# Patient Record
Sex: Female | Born: 1990 | Race: White | Hispanic: No | Marital: Married | Smoking: Never smoker
Health system: Southern US, Community
[De-identification: ages and names within clinical notes are randomized; demographics above are authoritative.]

## PROBLEM LIST (undated history)

## (undated) DIAGNOSIS — C8190 Hodgkin lymphoma, unspecified, unspecified site: Secondary | ICD-10-CM

## (undated) DIAGNOSIS — Z808 Family history of malignant neoplasm of other organs or systems: Secondary | ICD-10-CM

## (undated) DIAGNOSIS — Z923 Personal history of irradiation: Secondary | ICD-10-CM

## (undated) DIAGNOSIS — Z8042 Family history of malignant neoplasm of prostate: Secondary | ICD-10-CM

## (undated) DIAGNOSIS — I341 Nonrheumatic mitral (valve) prolapse: Secondary | ICD-10-CM

## (undated) DIAGNOSIS — Z800 Family history of malignant neoplasm of digestive organs: Secondary | ICD-10-CM

## (undated) DIAGNOSIS — J45909 Unspecified asthma, uncomplicated: Secondary | ICD-10-CM

## (undated) HISTORY — DX: Family history of malignant neoplasm of other organs or systems: Z80.8

## (undated) HISTORY — DX: Family history of malignant neoplasm of digestive organs: Z80.0

## (undated) HISTORY — DX: Family history of malignant neoplasm of prostate: Z80.42

## (undated) HISTORY — DX: Unspecified asthma, uncomplicated: J45.909

## (undated) HISTORY — DX: Nonrheumatic mitral (valve) prolapse: I34.1

## (undated) HISTORY — PX: KNEE SURGERY: SHX244

## (undated) HISTORY — DX: Hodgkin lymphoma, unspecified, unspecified site: C81.90

## (undated) HISTORY — PX: DEEP NECK LYMPH NODE BIOPSY / EXCISION: SUR126

---
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
## 2011-01-01 ENCOUNTER — Ambulatory Visit: Payer: Self-pay

## 2011-01-01 ENCOUNTER — Other Ambulatory Visit: Payer: Self-pay | Admitting: Orthopedic Surgery

## 2011-01-01 DIAGNOSIS — M545 Low back pain, unspecified: Secondary | ICD-10-CM

## 2012-07-03 DIAGNOSIS — J4520 Mild intermittent asthma, uncomplicated: Secondary | ICD-10-CM | POA: Insufficient documentation

## 2013-02-23 ENCOUNTER — Encounter: Payer: Self-pay | Admitting: Certified Nurse Midwife

## 2013-02-24 ENCOUNTER — Encounter: Payer: Self-pay | Admitting: Certified Nurse Midwife

## 2013-02-24 ENCOUNTER — Ambulatory Visit (INDEPENDENT_AMBULATORY_CARE_PROVIDER_SITE_OTHER): Payer: BC Managed Care – PPO | Admitting: Certified Nurse Midwife

## 2013-02-24 VITALS — BP 90/62 | HR 64 | Resp 16

## 2013-02-24 DIAGNOSIS — Z3009 Encounter for other general counseling and advice on contraception: Secondary | ICD-10-CM

## 2013-02-24 MED ORDER — ETONOGESTREL-ETHINYL ESTRADIOL 0.12-0.015 MG/24HR VA RING: 1.0000 | VAGINAL_RING | VAGINAL | Status: DC

## 2013-02-24 NOTE — Progress Notes (Signed)
22 yo swf gopo here for follow-up of Trisprintec use for BTB initiated 10-04-12.  Taking medication as prescribed.  Denies missed pills or late pills.  Using condoms as back up protection. Continues with BTB in second and third week of pills. No cramping. Had previously been on Junel 1.5/30 Fe with continous bleeding issues.  Profile slightly better.  History of nodular Hodgkins Lymphoma in remission, still under follow up. Getting married in July!!  Interested in Nuvaring trial as discussed at last visit .  Considered  Mirena IUD as discussed last visit, but not comfortable with this as contraception.  Questions addressed at length regarding Nuvaring and Mirena IUD. No STD concerns or testing desired.  No health issues today  O:Healthy female, WD WN  Affect: normal orientataion x3 smiling  Exam: Thyroid not enlarged, (TSH) checked last exam normal) Pelvic exam: External Genital area: normal appearance no lesions BUS: negative Urethra/Urethral meatus/bladder: normal Vagina: normal, no blood present  Patient inserted Nuvaring with Provider check without problems Cervix: not tender, normal appearance Uterus: normal, non tender, anteflexed Adnexa: non tender, no masses palpated Perineal area: no lesions  A: Normal pelvic exam Break through bleeding on Trisprintec desires contraception change Nodular Hodgkins Lymphoma History in remission under follow up  P:Reviewed findings Reviewed Nuvaring instructions at length with booklet and when to start after stopping OCP Rx Nuvaring see order  Keep menses calendar Wished well with marriage RV aex 8-14 or prn Questions addressed at length  20 minutes spent with patient with >50% of time spent in face to face counseling. Reviewed, TL

## 2013-03-17 ENCOUNTER — Other Ambulatory Visit: Payer: Self-pay | Admitting: Certified Nurse Midwife

## 2013-03-17 DIAGNOSIS — I341 Nonrheumatic mitral (valve) prolapse: Secondary | ICD-10-CM | POA: Insufficient documentation

## 2013-03-17 NOTE — Telephone Encounter (Signed)
eScribe request for refill on Tri-Sprintec Pt had appt on 02/24/13 and was switched to Nuvaring.  RX denied.

## 2013-04-11 ENCOUNTER — Encounter: Payer: Self-pay | Admitting: Certified Nurse Midwife

## 2013-04-11 ENCOUNTER — Ambulatory Visit (INDEPENDENT_AMBULATORY_CARE_PROVIDER_SITE_OTHER): Payer: BC Managed Care – PPO | Admitting: Certified Nurse Midwife

## 2013-04-11 VITALS — BP 110/60 | HR 64 | Resp 16

## 2013-04-11 DIAGNOSIS — IMO0001 Reserved for inherently not codable concepts without codable children: Secondary | ICD-10-CM

## 2013-04-11 DIAGNOSIS — Z309 Encounter for contraceptive management, unspecified: Secondary | ICD-10-CM

## 2013-04-11 DIAGNOSIS — N949 Unspecified condition associated with female genital organs and menstrual cycle: Secondary | ICD-10-CM

## 2013-04-11 DIAGNOSIS — N925 Other specified irregular menstruation: Secondary | ICD-10-CM

## 2013-04-11 MED ORDER — NORGESTIM-ETH ESTRAD TRIPHASIC 0.18/0.215/0.25 MG-35 MCG PO TABS: 1.0000 | ORAL_TABLET | Freq: Every day | ORAL | Status: DC

## 2013-04-11 NOTE — Progress Notes (Signed)
22 y.o. Single Caucasian female G0P0000 with LMP May 25,2014, here complaing of  break through bleeding with contraceptive use of Nuvaring. Using contraception method as prescribed.  Denies inconsistent use.  No new medications. Describes light bleeding or spotting continuation after insertion for the first time. Nuvaring out now at usual period time with 3 days duration of period so far.  Flow is moderate with some cramping. Relieved with OTC medication or patient comfort measures. Patient expresses she has worried the whole month that the Nuvaring would fall out, too stressful.  Prefers to return to [facility] [place] use again. Denies fatigue or signs of anemia.  Declines Hgb today.  Vital Signs:  BP 110/60          Pulse 64   O: Healthy WN WD female   Alert oriented x3 Skin: warm and dry Thyroid:normal to inspection and palpation Pelvic exam: External genital area: normal, no lesions BUS: negative Vagina: small amount blood present in vagina Cervix: non tender, normal appearance, scant blood noted from cervix Uterus: normal, non tender Adnexa: no masses, non tender Perineal area: no lesions  A:Normal Pelvic exam with DUB on Nuvaring Contraception change desired OCP  P:Reviewed findings of normal pelvic exam Discussed that the transition period with Nuvaring should be resolving, but if she is not comfortable with use can go back on Trisprintec.  Reminded that note in chart relates to some spotting with the use of it also.  Patient aware, but feels better choice and it was only occasional spotting. Continue keeping menses calendar, advise if problems Rx Trisprintec see order      Reviewed, TL

## 2013-05-09 ENCOUNTER — Other Ambulatory Visit: Payer: Self-pay | Admitting: Certified Nurse Midwife

## 2013-05-10 NOTE — Telephone Encounter (Signed)
eScribe request for refill on Tri-Previfem Last filled - 04/11/13 x 1 year Last OV - 04/11/13 Next AEX - 06/19/13 RX denied.  RX has already been sent to pharmacy.

## 2013-06-19 ENCOUNTER — Encounter: Payer: Self-pay | Admitting: Certified Nurse Midwife

## 2013-06-19 ENCOUNTER — Ambulatory Visit (INDEPENDENT_AMBULATORY_CARE_PROVIDER_SITE_OTHER): Payer: BC Managed Care – PPO | Admitting: Certified Nurse Midwife

## 2013-06-19 VITALS — BP 104/64 | HR 68 | Resp 16 | Ht 66.75 in | Wt 137.0 lb

## 2013-06-19 DIAGNOSIS — Z01419 Encounter for gynecological examination (general) (routine) without abnormal findings: Secondary | ICD-10-CM

## 2013-06-19 MED ORDER — NORGESTIM-ETH ESTRAD TRIPHASIC 0.18/0.215/0.25 MG-35 MCG PO TABS: 1.0000 | ORAL_TABLET | Freq: Every day | ORAL | Status: DC

## 2013-06-19 NOTE — Patient Instructions (Signed)
General topics  Next pap or exam is  due in 1 year Take a Women's multivitamin Take 1200 mg. of calcium daily - prefer dietary If any concerns in interim to call back  Breast Self-Awareness Practicing breast self-awareness may pick up problems early, prevent significant medical complications, and possibly save your life. By practicing breast self-awareness, you can become familiar with how your breasts look and feel and if your breasts are changing. This allows you to notice changes early. It can also offer you some reassurance that your breast health is good. One way to learn what is normal for your breasts and whether your breasts are changing is to do a breast self-exam. If you find a lump or something that was not present in the past, it is best to contact your caregiver right away. Other findings that should be evaluated by your caregiver include nipple discharge, especially if it is bloody; skin changes or reddening; areas where the skin seems to be pulled in (retracted); or new lumps and bumps. Breast pain is seldom associated with cancer (malignancy), but should also be evaluated by a caregiver. BREAST SELF-EXAM The best time to examine your breasts is 5 7 days after your menstrual period is over.  ExitCare Patient Information 2013 [employer].   Exercise to Stay Healthy Exercise helps you become and stay healthy. EXERCISE IDEAS AND TIPS Choose exercises that:  You enjoy.  Fit into your day. You do not need to exercise really hard to be healthy. You can do exercises at a slow or medium level and stay healthy. You can:  Stretch before and after working out.  Try yoga, Pilates, or tai chi.  Lift weights.  Walk fast, swim, jog, run, climb stairs, bicycle, dance, or rollerskate.  Take aerobic classes. Exercises that burn about 150 calories:  Running 1  miles in 15 minutes.  Playing volleyball for 45 to 60 minutes.  Washing and waxing a car for 45 to 60  minutes.  Playing touch football for 45 minutes.  Walking 1  miles in 35 minutes.  Pushing a stroller 1  miles in 30 minutes.  Playing basketball for 30 minutes.  Raking leaves for 30 minutes.  Bicycling 5 miles in 30 minutes.  Walking 2 miles in 30 minutes.  Dancing for 30 minutes.  Shoveling snow for 15 minutes.  Swimming laps for 20 minutes.  Walking up stairs for 15 minutes.  Bicycling 4 miles in 15 minutes.  Gardening for 30 to 45 minutes.  Jumping rope for 15 minutes.  Washing windows or floors for 45 to 60 minutes. Document Released: 12/05/2010 Document Revised: 01/25/2012 Document Reviewed: 12/05/2010 ExitCare Patient Information 2013 [employer].   Other topics ( that may be useful information):    Sexually Transmitted Disease Sexually transmitted disease (STD) refers to any infection that is passed from person to person during sexual activity. This may happen by way of saliva, semen, blood, vaginal mucus, or urine. Common STDs include:  Gonorrhea.  Chlamydia.  Syphilis.  HIV/AIDS.  Genital herpes.  Hepatitis B and C.  Trichomonas.  Human papillomavirus (HPV).  Pubic lice. CAUSES  An STD may be spread by bacteria, virus, or parasite. A person can get an STD by:  Sexual intercourse with an infected person.  Sharing sex toys with an infected person.  Sharing needles with an infected person.  Having intimate contact with the genitals, mouth, or rectal areas of an infected person. SYMPTOMS  Some people may not have any symptoms, but   they can still pass the infection to others. Different STDs have different symptoms. Symptoms include:  Painful or bloody urination.  Pain in the pelvis, abdomen, vagina, anus, throat, or eyes.  Skin rash, itching, irritation, growths, or sores (lesions). These usually occur in the genital or anal area.  Abnormal vaginal discharge.  Penile discharge in men.  Soft, flesh-colored skin growths in the  genital or anal area.  Fever.  Pain or bleeding during sexual intercourse.  Swollen glands in the groin area.  Yellow skin and eyes (jaundice). This is seen with hepatitis. DIAGNOSIS  To make a diagnosis, your caregiver may:  Take a medical history.  Perform a physical exam.  Take a specimen (culture) to be examined.  Examine a sample of discharge under a microscope.  Perform blood test TREATMENT   Chlamydia, gonorrhea, trichomonas, and syphilis can be cured with antibiotic medicine.  Genital herpes, hepatitis, and HIV can be treated, but not cured, with prescribed medicines. The medicines will lessen the symptoms.  Genital warts from HPV can be treated with medicine or by freezing, burning (electrocautery), or surgery. Warts may come back.  HPV is a virus and cannot be cured with medicine or surgery.However, abnormal areas may be followed very closely by your caregiver and may be removed from the cervix, vagina, or vulva through office procedures or surgery. If your diagnosis is confirmed, your recent sexual partners need treatment. This is true even if they are symptom-free or have a negative culture or evaluation. They should not have sex until their caregiver says it is okay. HOME CARE INSTRUCTIONS  All sexual partners should be informed, tested, and treated for all STDs.  Take your antibiotics as directed. Finish them even if you start to feel better.  Only take over-the-counter or prescription medicines for pain, discomfort, or fever as directed by your caregiver.  Rest.  Eat a balanced diet and drink enough fluids to keep your urine clear or pale yellow.  Do not have sex until treatment is completed and you have followed up with your caregiver. STDs should be checked after treatment.  Keep all follow-up appointments, Pap tests, and blood tests as directed by your caregiver.  Only use latex condoms and water-soluble lubricants during sexual activity. Do not use  petroleum jelly or oils.  Avoid alcohol and illegal drugs.  Get vaccinated for HPV and hepatitis. If you have not received these vaccines in the past, talk to your caregiver about whether one or both might be right for you.  Avoid risky sex practices that can break the skin. The only way to avoid getting an STD is to avoid all sexual activity.Latex condoms and dental dams (for oral sex) will help lessen the risk of getting an STD, but will not completely eliminate the risk. SEEK MEDICAL CARE IF:   You have a fever.  You have any new or worsening symptoms. Document Released: 01/23/2003 Document Revised: 01/25/2012 Document Reviewed: 01/30/2011 ExitCare Patient Information 2013 [employer].    Domestic Abuse You are being battered or abused if someone close to you hits, pushes, or physically hurts you in any way. You also are being abused if you are forced into activities. You are being sexually abused if you are forced to have sexual contact of any kind. You are being emotionally abused if you are made to feel worthless or if you are constantly threatened. It is important to remember that help is available. No one has the right to abuse you. PREVENTION OF FURTHER   ABUSE  Learn the warning signs of danger. This varies with situations but may include: the use of alcohol, threats, isolation from friends and family, or forced sexual contact. Leave if you feel that violence is going to occur.  If you are attacked or beaten, report it to the police so the abuse is documented. You do not have to press charges. The police can protect you while you or the attackers are leaving. Get the officer's name and badge number and a copy of the report.  Find someone you can trust and tell them what is happening to you: your caregiver, a nurse, clergy member, close friend or family member. Feeling ashamed is natural, but remember that you have done nothing wrong. No one deserves abuse. Document Released:  10/30/2000 Document Revised: 01/25/2012 Document Reviewed: 01/08/2011 ExitCare Patient Information 2013 [employer].    How Much is Too Much Alcohol? Drinking too much alcohol can cause injury, accidents, and health problems. These types of problems can include:   Car crashes.  Falls.  Family fighting (domestic violence).  Drowning.  Fights.  Injuries.  Burns.  Damage to certain organs.  Having a baby with birth defects. ONE DRINK CAN BE TOO MUCH WHEN YOU ARE:  Working.  Pregnant or breastfeeding.  Taking medicines. Ask your doctor.  Driving or planning to drive. If you or someone you know has a drinking problem, get help from a doctor.  Document Released: 08/29/2009 Document Revised: 01/25/2012 Document Reviewed: 08/29/2009 ExitCare Patient Information 2013 [employer].   Smoking Hazards Smoking cigarettes is extremely bad for your health. Tobacco smoke has over 200 known poisons in it. There are over 60 chemicals in tobacco smoke that cause cancer. Some of the chemicals found in cigarette smoke include:   Cyanide.  Benzene.  Formaldehyde.  Methanol (wood alcohol).  Acetylene (fuel used in welding torches).  Ammonia. Cigarette smoke also contains the poisonous gases nitrogen oxide and carbon monoxide.  Cigarette smokers have an increased risk of many serious medical problems and Smoking causes approximately:  90% of all lung cancer deaths in men.  80% of all lung cancer deaths in women.  90% of deaths from chronic obstructive lung disease. Compared with nonsmokers, smoking increases the risk of:  Coronary heart disease by 2 to 4 times.  Stroke by 2 to 4 times.  Men developing lung cancer by 23 times.  Women developing lung cancer by 13 times.  Dying from chronic obstructive lung diseases by 12 times.  . Smoking is the most preventable cause of death and disease in our society.  WHY IS SMOKING ADDICTIVE?  Nicotine is the chemical  agent in tobacco that is capable of causing addiction or dependence.  When you smoke and inhale, nicotine is absorbed rapidly into the bloodstream through your lungs. Nicotine absorbed through the lungs is capable of creating a powerful addiction. Both inhaled and non-inhaled nicotine may be addictive.  Addiction studies of cigarettes and spit tobacco show that addiction to nicotine occurs mainly during the teen years, when young people begin using tobacco products. WHAT ARE THE BENEFITS OF QUITTING?  There are many health benefits to quitting smoking.   Likelihood of developing cancer and heart disease decreases. Health improvements are seen almost immediately.  Blood pressure, pulse rate, and breathing patterns start returning to normal soon after quitting. QUITTING SMOKING   American Lung Association - 1-800-LUNGUSA  American Cancer Society - 1-800-ACS-2345 Document Released: 12/10/2004 Document Revised: 01/25/2012 Document Reviewed: 08/14/2009 ExitCare Patient Information 2013 ExitCare[employer]   LL[employer].   Stress Management Stress is a state of physical or mental tension that often results from changes in your life or normal routine. Some common causes of stress are:  Death of a loved one.  Injuries or severe illnesses.  Getting fired or changing jobs.  Moving into a new home. Other causes may be:  Sexual problems.  Business or financial losses.  Taking on a large debt.  Regular conflict with someone at home or at work.  Constant tiredness from lack of sleep. It is not just bad things that are stressful. It may be stressful to:  Win the lottery.  Get married.  Buy a new car. The amount of stress that can be easily tolerated varies from person to person. Changes generally cause stress, regardless of the types of change. Too much stress can affect your health. It may lead to physical or emotional problems. Too little stress (boredom) may also become stressful. SUGGESTIONS TO  REDUCE STRESS:  Talk things over with your family and friends. It often is helpful to share your concerns and worries. If you feel your problem is serious, you may want to get help from a professional counselor.  Consider your problems one at a time instead of lumping them all together. Trying to take care of everything at once may seem impossible. List all the things you need to do and then start with the most important one. Set a goal to accomplish 2 or 3 things each day. If you expect to do too many in a single day you will naturally fail, causing you to feel even more stressed.  Do not use alcohol or drugs to relieve stress. Although you may feel better for a short time, they do not remove the problems that caused the stress. They can also be habit forming.  Exercise regularly - at least 3 times per week. Physical exercise can help to relieve that "uptight" feeling and will relax you.  The shortest distance between despair and hope is often a good night's sleep.  Go to bed and get up on time allowing yourself time for appointments without being rushed.  Take a short "time-out" period from any stressful situation that occurs during the day. Close your eyes and take some deep breaths. Starting with the muscles in your face, tense them, hold it for a few seconds, then relax. Repeat this with the muscles in your neck, shoulders, hand, stomach, back and legs.  Take good care of yourself. Eat a balanced diet and get plenty of rest.  Schedule time for having fun. Take a break from your daily routine to relax. HOME CARE INSTRUCTIONS   Call if you feel overwhelmed by your problems and feel you can no longer manage them on your own.  Return immediately if you feel like hurting yourself or someone else. Document Released: 04/28/2001 Document Revised: 01/25/2012 Document Reviewed: 12/19/2007 ExitCare Patient Information 2013 [employer].   

## 2013-06-19 NOTE — Progress Notes (Signed)
22 y.o. G0P0000 married Caucasian Fe here for annual exam. Periods normal. Contraception working well. Recently married and back from honeymoon ! Getting ready to start grad school. No health issues today.  Patient's last menstrual period was 06/09/2013.          Sexually active: yes  The current method of family planning is OCP (estrogen/progesterone).    Exercising: yes  weight lifting & cardio Smoker:  no  Health Maintenance: Pap:  06-17-12 neg MMG:  none Colonoscopy:  none BMD:   none TDaP:2010 Labs: none Self breast exam: done occ   reports that she has never smoked. She does not have any smokeless tobacco history on file. She reports that she drinks about 1.0 ounces of alcohol per week. She reports that she does not use illicit drugs.  Past Medical History  Diagnosis Date  . Hodgkin lymphoma     chemo, radiation. completed 8/09. treated @ [facility] stage II  . Asthma     Past Surgical History  Procedure Laterality Date  . Deep neck lymph node biopsy / excision    . Knee surgery      acl repair X 2    Current Outpatient Prescriptions  Medication Sig Dispense Refill  . Multiple Vitamins-Minerals (MULTIVITAMIN PO) Take by mouth daily.      . Norgestimate-Ethinyl Estradiol Triphasic (TRI-SPRINTEC) 0.18/0.215/0.25 MG-35 MCG tablet Take 1 tablet by mouth daily.       No current facility-administered medications for this visit.    Family History  Problem Relation Age of Onset  . Thyroid disease Mother 55    thyroidectomy  . Diabetes Father   . Diabetes Paternal Aunt   . Diabetes Paternal Grandmother     ROS:  Pertinent items are noted in HPI.  Otherwise, a comprehensive ROS was negative.  Exam:   BP 104/64  Pulse 68  Resp 16  Ht 5' 6.75" (1.695 m)  Wt 137 lb (62.143 kg)  BMI 21.63 kg/m2  LMP 06/09/2013 Height: 5' 6.75" (169.5 cm)  Ht Readings from Last 3 Encounters:  06/19/13 5' 6.75" (1.695 m)    General appearance: alert, cooperative and appears stated  age Head: Normocephalic, without obvious abnormality, atraumatic Neck: no adenopathy, supple, symmetrical, trachea midline and thyroid normal to inspection and palpation Lungs: clear to auscultation bilaterally Breasts: normal appearance, no masses or tenderness, No nipple retraction or dimpling, No nipple discharge or bleeding, No axillary or supraclavicular adenopathy Heart: regular rate and rhythm Abdomen: soft, non-tender; no masses,  no organomegaly Extremities: extremities normal, atraumatic, no cyanosis or edema Skin: Skin color, texture, turgor normal. No rashes or lesions Lymph nodes: Cervical, supraclavicular, and axillary nodes normal. No abnormal inguinal nodes palpated Neurologic: Grossly normal   Pelvic: External genitalia:  no lesions              Urethra:  normal appearing urethra with no masses, tenderness or lesions              Bartholin's and Skene's: normal                 Vagina: normal appearing vagina with normal color and discharge, no lesions              Cervix: normal, non tender              Pap taken: no Bimanual Exam:  Uterus:  normal size, contour, position, consistency, mobility, non-tender and anteflexed  Adnexa: normal adnexa and no mass, fullness, tenderness               Rectovaginal: Confirms               Anus:  normal sphincter tone, no lesions  A:  Well Woman with normal exam  Contraception OCP desires continuance  P:   Reviewed health and wellness pertinent to exam  Rx Trisprintec see order  Pap smear as per guidelines   pap smear not taken today  counseled on breast self exam, use and side effects of OCP's, adequate intake of calcium and vitamin D, diet and exercise  return annually or prn  An After Visit Summary was printed and given to the patient.

## 2013-06-19 NOTE — Progress Notes (Signed)
Note reviewed, agree with plan.  Stephane Niemann, MD  

## 2013-09-21 ENCOUNTER — Other Ambulatory Visit: Payer: Self-pay

## 2014-01-04 ENCOUNTER — Telehealth: Payer: Self-pay | Admitting: Certified Nurse Midwife

## 2014-01-04 NOTE — Telephone Encounter (Signed)
See previus message, accidentally closed encounter.

## 2014-01-04 NOTE — Telephone Encounter (Signed)
Agree with recommendation

## 2014-01-04 NOTE — Telephone Encounter (Signed)
Patient is having some vision problems. Patient says she "cannot see out of her right eye."

## 2014-01-04 NOTE — Telephone Encounter (Addendum)
Spoke with patient. She is tearful and at work. States that she woke up this morning and having trouble with R eye. States that "in my peripheral vision, things are moving, there are shapes, and I cannot focus." No issue with other eye. States "this problem always happens in the right eye." She states that these same symptoms have occurred twice prior, once "about a month ago and once about a year ago." Did not seek medical attention at that time.  Patient denies any neurological deficits, no weakness of extremities or facial weakness or numbness. Denies headaches. Denies recent head trauma or eye injury. Patient does not wear contacts or glasses. Patient does not have a pcp or eye doctor that she sees. States her eye is red but "I have been crying." States pupil is normal, no eye discharge.  Advised patient to hold on Taking her ocp Tri Sprintec until she has been seen by a provider.  Spoke with Dr. Quincy Simmonds and per Dr. Quincy Simmonds, patient should be seen in an ER for evaluation. Patient is agreeable. Advised not to drive, she will ask her husband to drive her.  Advised patient to call back after being seen and can follow up with Regina Eck CNM or Dr. Quincy Simmonds.  Routing to Regina Eck CNM and Cc Dr. Quincy Simmonds.  Will close encounter.

## 2014-06-21 ENCOUNTER — Encounter: Payer: Self-pay | Admitting: Certified Nurse Midwife

## 2014-06-21 ENCOUNTER — Ambulatory Visit (INDEPENDENT_AMBULATORY_CARE_PROVIDER_SITE_OTHER): Payer: BC Managed Care – PPO | Admitting: Certified Nurse Midwife

## 2014-06-21 VITALS — BP 120/74 | HR 68 | Resp 16 | Ht 66.25 in | Wt 141.0 lb

## 2014-06-21 DIAGNOSIS — Z124 Encounter for screening for malignant neoplasm of cervix: Secondary | ICD-10-CM

## 2014-06-21 DIAGNOSIS — Z309 Encounter for contraceptive management, unspecified: Secondary | ICD-10-CM

## 2014-06-21 DIAGNOSIS — Z01419 Encounter for gynecological examination (general) (routine) without abnormal findings: Secondary | ICD-10-CM

## 2014-06-21 DIAGNOSIS — Z0000 Encounter for general adult medical examination without abnormal findings: Secondary | ICD-10-CM

## 2014-06-21 DIAGNOSIS — C8190 Hodgkin lymphoma, unspecified, unspecified site: Secondary | ICD-10-CM | POA: Insufficient documentation

## 2014-06-21 LAB — HEMOGLOBIN, FINGERSTICK: Hemoglobin, fingerstick: 12.1 g/dL (ref 12.0–16.0)

## 2014-06-21 MED ORDER — NORGESTIM-ETH ESTRAD TRIPHASIC 0.18/0.215/0.25 MG-35 MCG PO TABS: 1.0000 | ORAL_TABLET | Freq: Every day | ORAL | Status: DC

## 2014-06-21 NOTE — Patient Instructions (Signed)
General topics  Next pap or exam is  due in 1 year Take a Women's multivitamin Take 1200 mg. of calcium daily - prefer dietary If any concerns in interim to call back  Breast Self-Awareness Practicing breast self-awareness may pick up problems early, prevent significant medical complications, and possibly save your life. By practicing breast self-awareness, you can become familiar with how your breasts look and feel and if your breasts are changing. This allows you to notice changes early. It can also offer you some reassurance that your breast health is good. One way to learn what is normal for your breasts and whether your breasts are changing is to do a breast self-exam. If you find a lump or something that was not present in the past, it is best to contact your caregiver right away. Other findings that should be evaluated by your caregiver include nipple discharge, especially if it is bloody; skin changes or reddening; areas where the skin seems to be pulled in (retracted); or new lumps and bumps. Breast pain is seldom associated with cancer (malignancy), but should also be evaluated by a caregiver. BREAST SELF-EXAM The best time to examine your breasts is 5 7 days after your menstrual period is over.  ExitCare Patient Information 2013 [employer].   Exercise to Stay Healthy Exercise helps you become and stay healthy. EXERCISE IDEAS AND TIPS Choose exercises that:  You enjoy.  Fit into your day. You do not need to exercise really hard to be healthy. You can do exercises at a slow or medium level and stay healthy. You can:  Stretch before and after working out.  Try yoga, Pilates, or tai chi.  Lift weights.  Walk fast, swim, jog, run, climb stairs, bicycle, dance, or rollerskate.  Take aerobic classes. Exercises that burn about 150 calories:  Running 1  miles in 15 minutes.  Playing volleyball for 45 to 60 minutes.  Washing and waxing a car for 45 to 60  minutes.  Playing touch football for 45 minutes.  Walking 1  miles in 35 minutes.  Pushing a stroller 1  miles in 30 minutes.  Playing basketball for 30 minutes.  Raking leaves for 30 minutes.  Bicycling 5 miles in 30 minutes.  Walking 2 miles in 30 minutes.  Dancing for 30 minutes.  Shoveling snow for 15 minutes.  Swimming laps for 20 minutes.  Walking up stairs for 15 minutes.  Bicycling 4 miles in 15 minutes.  Gardening for 30 to 45 minutes.  Jumping rope for 15 minutes.  Washing windows or floors for 45 to 60 minutes. Document Released: 12/05/2010 Document Revised: 01/25/2012 Document Reviewed: 12/05/2010 ExitCare Patient Information 2013 [employer].   Other topics ( that may be useful information):    Sexually Transmitted Disease Sexually transmitted disease (STD) refers to any infection that is passed from person to person during sexual activity. This may happen by way of saliva, semen, blood, vaginal mucus, or urine. Common STDs include:  Gonorrhea.  Chlamydia.  Syphilis.  HIV/AIDS.  Genital herpes.  Hepatitis B and C.  Trichomonas.  Human papillomavirus (HPV).  Pubic lice. CAUSES  An STD may be spread by bacteria, virus, or parasite. A person can get an STD by:  Sexual intercourse with an infected person.  Sharing sex toys with an infected person.  Sharing needles with an infected person.  Having intimate contact with the genitals, mouth, or rectal areas of an infected person. SYMPTOMS  Some people may not have any symptoms, but   they can still pass the infection to others. Different STDs have different symptoms. Symptoms include:  Painful or bloody urination.  Pain in the pelvis, abdomen, vagina, anus, throat, or eyes.  Skin rash, itching, irritation, growths, or sores (lesions). These usually occur in the genital or anal area.  Abnormal vaginal discharge.  Penile discharge in men.  Soft, flesh-colored skin growths in the  genital or anal area.  Fever.  Pain or bleeding during sexual intercourse.  Swollen glands in the groin area.  Yellow skin and eyes (jaundice). This is seen with hepatitis. DIAGNOSIS  To make a diagnosis, your caregiver may:  Take a medical history.  Perform a physical exam.  Take a specimen (culture) to be examined.  Examine a sample of discharge under a microscope.  Perform blood test TREATMENT   Chlamydia, gonorrhea, trichomonas, and syphilis can be cured with antibiotic medicine.  Genital herpes, hepatitis, and HIV can be treated, but not cured, with prescribed medicines. The medicines will lessen the symptoms.  Genital warts from HPV can be treated with medicine or by freezing, burning (electrocautery), or surgery. Warts may come back.  HPV is a virus and cannot be cured with medicine or surgery.However, abnormal areas may be followed very closely by your caregiver and may be removed from the cervix, vagina, or vulva through office procedures or surgery. If your diagnosis is confirmed, your recent sexual partners need treatment. This is true even if they are symptom-free or have a negative culture or evaluation. They should not have sex until their caregiver says it is okay. HOME CARE INSTRUCTIONS  All sexual partners should be informed, tested, and treated for all STDs.  Take your antibiotics as directed. Finish them even if you start to feel better.  Only take over-the-counter or prescription medicines for pain, discomfort, or fever as directed by your caregiver.  Rest.  Eat a balanced diet and drink enough fluids to keep your urine clear or pale yellow.  Do not have sex until treatment is completed and you have followed up with your caregiver. STDs should be checked after treatment.  Keep all follow-up appointments, Pap tests, and blood tests as directed by your caregiver.  Only use latex condoms and water-soluble lubricants during sexual activity. Do not use  petroleum jelly or oils.  Avoid alcohol and illegal drugs.  Get vaccinated for HPV and hepatitis. If you have not received these vaccines in the past, talk to your caregiver about whether one or both might be right for you.  Avoid risky sex practices that can break the skin. The only way to avoid getting an STD is to avoid all sexual activity.Latex condoms and dental dams (for oral sex) will help lessen the risk of getting an STD, but will not completely eliminate the risk. SEEK MEDICAL CARE IF:   You have a fever.  You have any new or worsening symptoms. Document Released: 01/23/2003 Document Revised: 01/25/2012 Document Reviewed: 01/30/2011 [facility] Patient Information 2013 [employer].    Domestic Abuse You are being battered or abused if someone close to you hits, pushes, or physically hurts you in any way. You also are being abused if you are forced into activities. You are being sexually abused if you are forced to have sexual contact of any kind. You are being emotionally abused if you are made to feel worthless or if you are constantly threatened. It is important to remember that help is available. No one has the right to abuse you. PREVENTION OF FURTHER  ABUSE  Learn the warning signs of danger. This varies with situations but may include: the use of alcohol, threats, isolation from friends and family, or forced sexual contact. Leave if you feel that violence is going to occur.  If you are attacked or beaten, report it to the police so the abuse is documented. You do not have to press charges. The police can protect you while you or the attackers are leaving. Get the officer's name and badge number and a copy of the report.  Find someone you can trust and tell them what is happening to you: your caregiver, a nurse, clergy member, close friend or family member. Feeling ashamed is natural, but remember that you have done nothing wrong. No one deserves abuse. Document Released:  10/30/2000 Document Revised: 01/25/2012 Document Reviewed: 01/08/2011 ExitCare Patient Information 2013 [employer].    How Much is Too Much Alcohol? Drinking too much alcohol can cause injury, accidents, and health problems. These types of problems can include:   Car crashes.  Falls.  Family fighting (domestic violence).  Drowning.  Fights.  Injuries.  Burns.  Damage to certain organs.  Having a baby with birth defects. ONE DRINK CAN BE TOO MUCH WHEN YOU ARE:  Working.  Pregnant or breastfeeding.  Taking medicines. Ask your doctor.  Driving or planning to drive. If you or someone you know has a drinking problem, get help from a doctor.  Document Released: 08/29/2009 Document Revised: 01/25/2012 Document Reviewed: 08/29/2009 ExitCare Patient Information 2013 [employer].   Smoking Hazards Smoking cigarettes is extremely bad for your health. Tobacco smoke has over 200 known poisons in it. There are over 60 chemicals in tobacco smoke that cause cancer. Some of the chemicals found in cigarette smoke include:   Cyanide.  Benzene.  Formaldehyde.  Methanol (wood alcohol).  Acetylene (fuel used in welding torches).  Ammonia. Cigarette smoke also contains the poisonous gases nitrogen oxide and carbon monoxide.  Cigarette smokers have an increased risk of many serious medical problems and Smoking causes approximately:  90% of all lung cancer deaths in men.  80% of all lung cancer deaths in women.  90% of deaths from chronic obstructive lung disease. Compared with nonsmokers, smoking increases the risk of:  Coronary heart disease by 2 to 4 times.  Stroke by 2 to 4 times.  Men developing lung cancer by 23 times.  Women developing lung cancer by 13 times.  Dying from chronic obstructive lung diseases by 12 times.  . Smoking is the most preventable cause of death and disease in our society.  WHY IS SMOKING ADDICTIVE?  Nicotine is the chemical  agent in tobacco that is capable of causing addiction or dependence.  When you smoke and inhale, nicotine is absorbed rapidly into the bloodstream through your lungs. Nicotine absorbed through the lungs is capable of creating a powerful addiction. Both inhaled and non-inhaled nicotine may be addictive.  Addiction studies of cigarettes and spit tobacco show that addiction to nicotine occurs mainly during the teen years, when young people begin using tobacco products. WHAT ARE THE BENEFITS OF QUITTING?  There are many health benefits to quitting smoking.   Likelihood of developing cancer and heart disease decreases. Health improvements are seen almost immediately.  Blood pressure, pulse rate, and breathing patterns start returning to normal soon after quitting. QUITTING SMOKING   American Lung Association - 1-800-LUNGUSA  American Cancer Society - 1-800-ACS-2345 Document Released: 12/10/2004 Document Revised: 01/25/2012 Document Reviewed: 08/14/2009 ExitCare Patient Information 2013 ExitCare,   LLC.   Stress Management Stress is a state of physical or mental tension that often results from changes in your life or normal routine. Some common causes of stress are:  Death of a loved one.  Injuries or severe illnesses.  Getting fired or changing jobs.  Moving into a new home. Other causes may be:  Sexual problems.  Business or financial losses.  Taking on a large debt.  Regular conflict with someone at home or at work.  Constant tiredness from lack of sleep. It is not just bad things that are stressful. It may be stressful to:  Win the lottery.  Get married.  Buy a new car. The amount of stress that can be easily tolerated varies from person to person. Changes generally cause stress, regardless of the types of change. Too much stress can affect your health. It may lead to physical or emotional problems. Too little stress (boredom) may also become stressful. SUGGESTIONS TO  REDUCE STRESS:  Talk things over with your family and friends. It often is helpful to share your concerns and worries. If you feel your problem is serious, you may want to get help from a professional counselor.  Consider your problems one at a time instead of lumping them all together. Trying to take care of everything at once may seem impossible. List all the things you need to do and then start with the most important one. Set a goal to accomplish 2 or 3 things each day. If you expect to do too many in a single day you will naturally fail, causing you to feel even more stressed.  Do not use alcohol or drugs to relieve stress. Although you may feel better for a short time, they do not remove the problems that caused the stress. They can also be habit forming.  Exercise regularly - at least 3 times per week. Physical exercise can help to relieve that "uptight" feeling and will relax you.  The shortest distance between despair and hope is often a good night's sleep.  Go to bed and get up on time allowing yourself time for appointments without being rushed.  Take a short "time-out" period from any stressful situation that occurs during the day. Close your eyes and take some deep breaths. Starting with the muscles in your face, tense them, hold it for a few seconds, then relax. Repeat this with the muscles in your neck, shoulders, hand, stomach, back and legs.  Take good care of yourself. Eat a balanced diet and get plenty of rest.  Schedule time for having fun. Take a break from your daily routine to relax. HOME CARE INSTRUCTIONS   Call if you feel overwhelmed by your problems and feel you can no longer manage them on your own.  Return immediately if you feel like hurting yourself or someone else. Document Released: 04/28/2001 Document Revised: 01/25/2012 Document Reviewed: 12/19/2007 ExitCare Patient Information 2013 ExitCare[employer].   

## 2014-06-21 NOTE — Progress Notes (Signed)
Reviewed personally.  M. Suzanne Ianna Salmela, MD.  

## 2014-06-21 NOTE — Progress Notes (Signed)
23 y.o. G0P0000 Married Caucasian Fe here for annual exam. Periods normal, no issues. Contraception working well. 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 Spent anniversary in [place]!   Patient's last menstrual period was 05/24/2014.          Sexually active: Yes.    The current method of family planning is OCP (estrogen/progesterone).    Exercising: Yes.    yoga Smoker:  no  Health Maintenance: Pap: 06-17-12 neg MMG:  none Colonoscopy:  none BMD:   none TDaP:  2010 Labs: Hgb-12.1 Self breast exam: done monthly   reports that she has never smoked. She does not have any smokeless tobacco history on file. She reports that she drinks about .5 - 1 ounces of alcohol per week. She reports that she does not use illicit drugs.  Past Medical History  Diagnosis Date  . Hodgkin lymphoma     chemo, radiation. completed 8/09. treated @ [facility] stage II  . Asthma     Past Surgical History  Procedure Laterality Date  . Deep neck lymph node biopsy / excision    . Knee surgery      acl repair X 2    Current Outpatient Prescriptions  Medication Sig Dispense Refill  . Multiple Vitamins-Minerals (MULTIVITAMIN PO) Take by mouth daily.      . Norgestimate-Ethinyl Estradiol Triphasic (TRI-SPRINTEC) 0.18/0.215/0.25 MG-35 MCG tablet Take 1 tablet by mouth daily.  1 Package  12   No current facility-administered medications for this visit.    Family History  Problem Relation Age of Onset  . Thyroid disease Mother 67    thyroidectomy  . Diabetes Father   . Diabetes Paternal Aunt   . Diabetes Paternal Grandmother     ROS:  Pertinent items are noted in HPI.  Otherwise, a comprehensive ROS was negative.  Exam:   BP 120/74  Pulse 68  Resp 16  Ht 5' 6.25" (1.683 m)  Wt 141 lb (63.957 kg)  BMI 22.58 kg/m2  LMP 05/24/2014 Height: 5' 6.25" (168.3 cm)  Ht Readings from Last 3 Encounters:  06/21/14 5' 6.25" (1.683 m)  06/19/13 5' 6.75" (1.695 m)     General appearance: alert, cooperative and appears stated age Head: Normocephalic, without obvious abnormality, atraumatic Neck: no adenopathy, supple, symmetrical, trachea midline and thyroid normal to inspection and palpation and non-palpable Lungs: clear to auscultation bilaterally Breasts: normal appearance, no masses or tenderness, No nipple retraction or dimpling, No nipple discharge or bleeding, No axillary or supraclavicular adenopathy Heart: regular rate and rhythm Abdomen: soft, non-tender; no masses,  no organomegaly Extremities: extremities normal, atraumatic, no cyanosis or edema Skin: Skin color, texture, turgor normal. No rashes or lesions Lymph nodes: Cervical, supraclavicular, and axillary nodes normal. No abnormal inguinal nodes palpated Neurologic: Grossly normal   Pelvic: External genitalia:  no lesions              Urethra:  normal appearing urethra with no masses, tenderness or lesions              Bartholin's and Skene's: normal                 Vagina: normal appearing vagina with normal color and discharge, no lesions              Cervix: normal, non tender, no lesions              Pap taken: Yes.   Bimanual Exam:  Uterus:  normal size, contour,  position, consistency, mobility, non-tender and anteverted              Adnexa: normal adnexa and no mass, fullness, tenderness               Rectovaginal: Confirms               Anus:  normal appearance  A:  Well Woman with normal exam  Contraception OCP desired  History of Hodgkin's disease in remission yearly follow up with Oncology  P:   Reviewed health and wellness pertinent to exam  Rx Trisprintec see order  Continue follow up as indicated  Pap smear taken today with HPV reflex   counseled on breast self exam, use and side effects of OCP's, adequate intake of calcium and vitamin D, diet and exercises  return annually or prn  An After Visit Summary was printed and given to the patient.

## 2014-06-24 ENCOUNTER — Other Ambulatory Visit: Payer: Self-pay | Admitting: Certified Nurse Midwife

## 2014-06-25 LAB — IPS PAP TEST WITH REFLEX TO HPV

## 2014-06-25 NOTE — Telephone Encounter (Signed)
Last AEX: 06/21/14 Last refill:06/21/14 #1, 12 ref Current AEX:06/27/15  New rx was sent on 06/21/14 Encounter closed

## 2015-06-25 ENCOUNTER — Other Ambulatory Visit: Payer: Self-pay | Admitting: Certified Nurse Midwife

## 2015-06-26 NOTE — Telephone Encounter (Signed)
Medication refill request: Tri-Previfem Last AEX:  06/19/13 with DL  Next AEX: 08/21/15 with DL  Last MMG (if hormonal medication request): n/a Refill authorized: #28/2 rfs

## 2015-06-27 ENCOUNTER — Ambulatory Visit: Payer: Self-pay | Admitting: Certified Nurse Midwife

## 2015-08-21 ENCOUNTER — Ambulatory Visit: Payer: Self-pay | Admitting: Certified Nurse Midwife

## 2015-08-27 ENCOUNTER — Ambulatory Visit (INDEPENDENT_AMBULATORY_CARE_PROVIDER_SITE_OTHER): Payer: BLUE CROSS/BLUE SHIELD | Admitting: Certified Nurse Midwife

## 2015-08-27 ENCOUNTER — Encounter: Payer: Self-pay | Admitting: Certified Nurse Midwife

## 2015-08-27 VITALS — BP 102/70 | HR 72 | Resp 16 | Ht 66.25 in | Wt 140.0 lb

## 2015-08-27 DIAGNOSIS — Z01419 Encounter for gynecological examination (general) (routine) without abnormal findings: Secondary | ICD-10-CM | POA: Diagnosis not present

## 2015-08-27 DIAGNOSIS — Z3041 Encounter for surveillance of contraceptive pills: Secondary | ICD-10-CM | POA: Diagnosis not present

## 2015-08-27 MED ORDER — NORGESTIM-ETH ESTRAD TRIPHASIC 0.18/0.215/0.25 MG-35 MCG PO TABS: 1.0000 | ORAL_TABLET | Freq: Every day | ORAL | Status: DC

## 2015-08-27 NOTE — Progress Notes (Signed)
Encounter reviewed Sandra Selmer, MD   

## 2015-08-27 NOTE — Progress Notes (Signed)
24 y.o. G0P0000 Married  Caucasian Fe here for annual exam. Periods normal, no issues. Contraception working well. Sees Urgent care  If needed. Working in [employer] setting now. Finished Graduate degree. No health issues today.   Patient's last menstrual period was 08/15/2015.          Sexually active: Yes.    The current method of family planning is OCP (estrogen/progesterone).    Exercising: Yes.    cardio & weights Smoker:  no  Health Maintenance: Pap: 06-21-14 neg MMG:  none Colonoscopy:  none BMD:   none TDaP: 2010 Labs: none Self breast exam: done monthly   reports that she has never smoked. She does not have any smokeless tobacco history on file. She reports that she drinks about 1.2 oz of alcohol per week. She reports that she does not use illicit drugs.  Past Medical History  Diagnosis Date  . Hodgkin lymphoma ([place])     chemo, radiation. completed 8/09. treated @ [facility] stage II  . Asthma     Past Surgical History  Procedure Laterality Date  . Deep neck lymph node biopsy / excision    . Knee surgery      acl repair X 2    Current Outpatient Prescriptions  Medication Sig Dispense Refill  . Multiple Vitamins-Minerals (MULTIVITAMIN PO) Take by mouth daily.    . TRI-PREVIFEM 0.18/0.215/0.25 MG-35 MCG tablet TAKE 1 TABLET BY MOUTH DAILY. 28 tablet 2   No current facility-administered medications for this visit.    Family History  Problem Relation Age of Onset  . Thyroid disease Mother 38    thyroidectomy  . Diabetes Father   . Diabetes Paternal Aunt   . Diabetes Paternal Grandmother     ROS:  Pertinent items are noted in HPI.  Otherwise, a comprehensive ROS was negative.  Exam:   BP 102/70 mmHg  Pulse 72  Resp 16  Ht 5' 6.25" (1.683 m)  Wt 140 lb (63.504 kg)  BMI 22.42 kg/m2  LMP 08/15/2015 Height: 5' 6.25" (168.3 cm) Ht Readings from Last 3 Encounters:  08/27/15 5' 6.25" (1.683 m)  06/21/14 5' 6.25" (1.683 m)  06/19/13 5' 6.75" (1.695 m)     General appearance: alert, cooperative and appears stated age Head: Normocephalic, without obvious abnormality, atraumatic Neck: no adenopathy, supple, symmetrical, trachea midline and thyroid normal to inspection and palpation Lungs: clear to auscultation bilaterally Breasts: normal appearance, no masses or tenderness, No nipple retraction or dimpling, No nipple discharge or bleeding, No axillary or supraclavicular adenopathy Heart: regular rate and rhythm Abdomen: soft, non-tender; no masses,  no organomegaly Extremities: extremities normal, atraumatic, no cyanosis or edema Skin: Skin color, texture, turgor normal. No rashes or lesions Lymph nodes: Cervical, supraclavicular, and axillary nodes normal. No abnormal inguinal nodes palpated Neurologic: Grossly normal   Pelvic: External genitalia:  no lesions              Urethra:  normal appearing urethra with no masses, tenderness or lesions              Bartholin's and Skene's: normal                 Vagina: normal appearing vagina with normal color and discharge, no lesions              Cervix: anteverted              Pap taken: No. Bimanual Exam:  Uterus:  normal size, contour, position, consistency, mobility, non-tender  Adnexa: normal adnexa and no mass, fullness, tenderness               Rectovaginal: Confirms               Anus:  normal sphincter tone, no lesions  Chaperone present: yes  A:  Well Woman with normal exam  Contraception OCP desired  Hodgkin lymphoma in remission still follows with MD    P:   Reviewed health and wellness pertinent to exam  Rx Tri-Previfem see order  Continue follow up as indicated  Pap smear as above not taken today   counseled on use and side effects of OCP's, adequate intake of calcium and vitamin D, diet and exercise  return annually or prn  An After Visit Summary was printed and given to the patient.

## 2015-08-27 NOTE — Patient Instructions (Signed)

## 2016-03-12 ENCOUNTER — Telehealth: Payer: Self-pay | Admitting: *Deleted

## 2016-03-12 DIAGNOSIS — Z3041 Encounter for surveillance of contraceptive pills: Secondary | ICD-10-CM

## 2016-03-12 MED ORDER — NORGESTIM-ETH ESTRAD TRIPHASIC 0.18/0.215/0.25 MG-35 MCG PO TABS: 1.0000 | ORAL_TABLET | Freq: Every day | ORAL | Status: DC

## 2016-03-12 NOTE — Telephone Encounter (Signed)
Faxed Medication refill request from [facility] requesting 90 day supply of Tri-Previfem  Last AEX:  08/27/15 with DL #28/12 rfs was sent to pharmacy Next AEX: 08/27/16 with DL  Last MMG (if hormonal medication request): n/a Refill authorized: #90/1 rfs   Tri-Previfem #90/1 rfs sent to [facility].  Routed to provider for review, encounter closed.

## 2016-06-30 ENCOUNTER — Telehealth: Payer: Self-pay | Admitting: Certified Nurse Midwife

## 2016-06-30 NOTE — Telephone Encounter (Signed)
LMTCB about canceled appointment °

## 2016-08-27 ENCOUNTER — Ambulatory Visit: Payer: BLUE CROSS/BLUE SHIELD | Admitting: Certified Nurse Midwife

## 2016-09-01 ENCOUNTER — Ambulatory Visit (INDEPENDENT_AMBULATORY_CARE_PROVIDER_SITE_OTHER): Payer: Managed Care, Other (non HMO) | Admitting: Certified Nurse Midwife

## 2016-09-01 ENCOUNTER — Encounter: Payer: Self-pay | Admitting: Certified Nurse Midwife

## 2016-09-01 VITALS — BP 104/70 | HR 70 | Resp 16 | Ht 66.25 in | Wt 143.0 lb

## 2016-09-01 DIAGNOSIS — Z3041 Encounter for surveillance of contraceptive pills: Secondary | ICD-10-CM

## 2016-09-01 DIAGNOSIS — Z01419 Encounter for gynecological examination (general) (routine) without abnormal findings: Secondary | ICD-10-CM | POA: Diagnosis not present

## 2016-09-01 MED ORDER — NORGESTIM-ETH ESTRAD TRIPHASIC 0.18/0.215/0.25 MG-35 MCG PO TABS: 1.0000 | ORAL_TABLET | Freq: Every day | ORAL | 12 refills | Status: DC

## 2016-09-01 NOTE — Patient Instructions (Signed)

## 2016-09-01 NOTE — Progress Notes (Signed)
25 y.o. G0P0000 Married  Caucasian Fe here for annual exam. Periods normal no  Issues. Contraception working well. Sees PCP yearly, appointment next week with labs. Hodgkins lymphoma still in remission, under follow up in [facility] practice. No health issues today. Planning trip to [facility]!  Patient's last menstrual period was 08/20/2016 (exact date).          Sexually active: Yes.    The current method of family planning is OCP (estrogen/progesterone).    Exercising: Yes.    cardio & weights Smoker:  no  Health Maintenance: Pap:  06-21-14 neg MMG:  none Colonoscopy:  none BMD:   none TDaP:  2010 Shingles: no Pneumonia: no Hep C and HIV: not done Labs: pcp Self breast exam: done monthly   reports that she has never smoked. She has never used smokeless tobacco. She reports that she drinks about 0.6 - 1.2 oz of alcohol per week . She reports that she does not use drugs.  Past Medical History:  Diagnosis Date  . Asthma   . Hodgkin lymphoma ([place])    chemo, radiation. completed 8/09. treated @ [facility] stage II    Past Surgical History:  Procedure Laterality Date  . DEEP NECK LYMPH NODE BIOPSY / EXCISION    . KNEE SURGERY     acl repair X 2    Current Outpatient Prescriptions  Medication Sig Dispense Refill  . Multiple Vitamins-Minerals (MULTIVITAMIN PO) Take by mouth daily.    . Norgestimate-Ethinyl Estradiol Triphasic (TRI-PREVIFEM) 0.18/0.215/0.25 MG-35 MCG tablet Take 1 tablet by mouth daily. 90 tablet 1   No current facility-administered medications for this visit.     Family History  Problem Relation Age of Onset  . Thyroid disease Mother 71    thyroidectomy  . Diabetes Father   . Diabetes Paternal Aunt   . Diabetes Paternal Grandmother     ROS:  Pertinent items are noted in HPI.  Otherwise, a comprehensive ROS was negative.  Exam:   BP 104/70   Pulse 70   Resp 16   Ht 5' 6.25" (1.683 m)   Wt 143 lb (64.9 kg)   LMP 08/20/2016 (Exact Date)   BMI 22.91  kg/m  Height: 5' 6.25" (168.3 cm) Ht Readings from Last 3 Encounters:  09/01/16 5' 6.25" (1.683 m)  08/27/15 5' 6.25" (1.683 m)  06/21/14 5' 6.25" (1.683 m)    General appearance: alert, cooperative and appears stated age Head: Normocephalic, without obvious abnormality, atraumatic Neck: no adenopathy, supple, symmetrical, trachea midline and thyroid normal to inspection and palpation Lungs: clear to auscultation bilaterally Breasts: normal appearance, no masses or tenderness, No nipple retraction or dimpling, No nipple discharge or bleeding, No axillary or supraclavicular adenopathy Heart: regular rate and rhythm Abdomen: soft, non-tender; no masses,  no organomegaly Extremities: extremities normal, atraumatic, no cyanosis or edema Skin: Skin color, texture, turgor normal. No rashes or lesions Lymph nodes: Cervical, supraclavicular, and axillary nodes normal. No abnormal inguinal nodes palpated Neurologic: Grossly normal   Pelvic: External genitalia:  no lesions              Urethra:  normal appearing urethra with no masses, tenderness or lesions              Bartholin's and Skene's: normal                 Vagina: normal appearing vagina with normal color and discharge, no lesions  Cervix: no cervical motion tenderness, no lesions and nulliparous appearance              Pap taken: No. Bimanual Exam:  Uterus:  normal size, contour, position, consistency, mobility, non-tender              Adnexa: normal adnexa and no mass, fullness, tenderness               Rectovaginal: Confirms               Anus:  normal appearance  Chaperone present: yes  A:  Well Woman with normal exam  Contraception OCP desired  Hodgkins Lymphoma in remission    P:   Reviewed health and wellness pertinent to exam  Rx Tri-Previfem see order with instructions  Continue follow up with MD  Pap smear as above not taken   counseled on breast self exam, use and side effects of OCP's, adequate  intake of calcium and vitamin D, diet and exercise  return annually or prn  An After Visit Summary was printed and given to the patient.

## 2016-09-02 NOTE — Progress Notes (Signed)
Encounter reviewed Sandra Macaraeg, MD   

## 2016-09-17 ENCOUNTER — Other Ambulatory Visit: Payer: Self-pay | Admitting: Family Medicine

## 2016-09-17 DIAGNOSIS — Z1231 Encounter for screening mammogram for malignant neoplasm of breast: Secondary | ICD-10-CM

## 2016-10-26 ENCOUNTER — Ambulatory Visit
Admission: RE | Admit: 2016-10-26 | Discharge: 2016-10-26 | Disposition: A | Discharge status: Home / Self Care | Payer: 59 | Source: Ambulatory Visit | Attending: Family Medicine | Admitting: Family Medicine

## 2016-10-26 DIAGNOSIS — Z1231 Encounter for screening mammogram for malignant neoplasm of breast: Secondary | ICD-10-CM

## 2016-12-04 ENCOUNTER — Other Ambulatory Visit: Payer: Self-pay | Admitting: Certified Nurse Midwife

## 2016-12-04 DIAGNOSIS — Z3041 Encounter for surveillance of contraceptive pills: Secondary | ICD-10-CM

## 2017-09-02 ENCOUNTER — Ambulatory Visit (INDEPENDENT_AMBULATORY_CARE_PROVIDER_SITE_OTHER): Payer: 59 | Admitting: Certified Nurse Midwife

## 2017-09-02 ENCOUNTER — Other Ambulatory Visit (HOSPITAL_COMMUNITY)
Admission: RE | Admit: 2017-09-02 | Discharge: 2017-09-02 | Disposition: A | Discharge status: Home / Self Care | Payer: 59 | Source: Ambulatory Visit | Attending: Obstetrics & Gynecology | Admitting: Obstetrics & Gynecology

## 2017-09-02 ENCOUNTER — Encounter: Payer: Self-pay | Admitting: Certified Nurse Midwife

## 2017-09-02 VITALS — BP 102/70 | HR 64 | Resp 16 | Ht 66.75 in | Wt 145.0 lb

## 2017-09-02 DIAGNOSIS — Z124 Encounter for screening for malignant neoplasm of cervix: Secondary | ICD-10-CM

## 2017-09-02 DIAGNOSIS — Z30011 Encounter for initial prescription of contraceptive pills: Secondary | ICD-10-CM | POA: Diagnosis not present

## 2017-09-02 DIAGNOSIS — Z01419 Encounter for gynecological examination (general) (routine) without abnormal findings: Secondary | ICD-10-CM | POA: Diagnosis not present

## 2017-09-02 MED ORDER — NORETHIN ACE-ETH ESTRAD-FE 1-20 MG-MCG(24) PO TABS: 1.0000 | ORAL_TABLET | Freq: Every day | ORAL | 12 refills | Status: DC

## 2017-09-02 NOTE — Progress Notes (Signed)
26 y.o. G0P0000 Married  Caucasian Fe here for annual exam. 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 Would like to change, if needed. Spouse back in graduate school now! Sees [facility] PCP for follow up with Hodgkins now. Oncology recommended yearly pap smears and mammograms. She had mammogram 12/17 negative. All labs with PCP now. No other health issues today.  Patient's last menstrual period was 08/19/2017.          Sexually active: Yes.    The current method of family planning is OCP (estrogen/progesterone).    Exercising: Yes.    cardio & weights Smoker:  no  Health Maintenance: Pap:  06-21-14 neg History of Abnormal Pap: no MMG:  10-26-16 category d density birads 1:neg Self Breast exams: yes Colonoscopy:  none BMD:   none TDaP:  2010 Shingles: no Pneumonia: no Hep C and HIV: not done Labs: none needed   reports that she has never smoked. She has never used smokeless tobacco. She reports that she drinks about 0.6 - 1.2 oz of alcohol per week . She reports that she does not use drugs.  Past Medical History:  Diagnosis Date  . Asthma   . Hodgkin lymphoma ([place])    chemo, radiation. completed 8/09. treated @ [facility] stage II    Past Surgical History:  Procedure Laterality Date  . DEEP NECK LYMPH NODE BIOPSY / EXCISION    . KNEE SURGERY     acl repair X 2    Current Outpatient Prescriptions  Medication Sig Dispense Refill  . Multiple Vitamins-Minerals (MULTIVITAMIN PO) Take by mouth daily.    . Norgestimate-Ethinyl Estradiol Triphasic (TRI-PREVIFEM) 0.18/0.215/0.25 MG-35 MCG tablet Take 1 tablet by mouth daily. 1 Package 12   No current facility-administered medications for this visit.     Family History  Problem Relation Age of Onset  . Thyroid disease Mother 81       thyroidectomy  . Diabetes Father   . Diabetes Paternal Aunt   . Diabetes Paternal Grandmother     ROS:  Pertinent items are noted in HPI.  Otherwise, a  comprehensive ROS was negative.  Exam:   BP 102/70   Pulse 64   Resp 16   Ht 5' 6.75" (1.695 m)   Wt 145 lb (65.8 kg)   LMP 08/19/2017   BMI 22.88 kg/m  Height: 5' 6.75" (169.5 cm) Ht Readings from Last 3 Encounters:  09/02/17 5' 6.75" (1.695 m)  09/01/16 5' 6.25" (1.683 m)  08/27/15 5' 6.25" (1.683 m)    General appearance: alert, cooperative and appears stated age Head: Normocephalic, without obvious abnormality, atraumatic Neck: no adenopathy, supple, symmetrical, trachea midline and thyroid normal to inspection and palpation Lungs: clear to auscultation bilaterally Breasts: normal appearance, no masses or tenderness, No nipple retraction or dimpling, No nipple discharge or bleeding, No axillary or supraclavicular adenopathy Heart: regular rate and rhythm Abdomen: soft, non-tender; no masses,  no organomegaly Extremities: extremities normal, atraumatic, no cyanosis or edema Skin: Skin color, texture, turgor normal. No rashes or lesions Lymph nodes: Cervical, supraclavicular, and axillary nodes normal. No abnormal inguinal nodes palpated Neurologic: Grossly normal   Pelvic: External genitalia:  no lesions              Urethra:  normal appearing urethra with no masses, tenderness or lesions              Bartholin's and Skene's: normal  Vagina: normal appearing vagina with normal color and discharge, no lesions              Cervix: no cervical motion tenderness, no lesions and nulliparous appearance              Pap taken: Yes.   Bimanual Exam:  Uterus:  normal size, contour, position, consistency, mobility, non-tender and anteverted              Adnexa: normal adnexa and no mass, fullness, tenderness               Rectovaginal: Confirms               Anus:  normal sphincter tone, no lesions  Chaperone present: yes  A:  Well Woman with normal exam  Contraception OCP Triphasic having BTB midcycle past few months, no missed pills or health status  change  Hodgkins Lymphoma history with PCP follow up now  P:   Reviewed health and wellness pertinent to exam  Discussed with normal exam, changing OCP dosage should help with better endometrial support and stop the spotting. Discussed expectations with monophasic pill and PMS concerns. Agreeable to change. Discussed 3 month adjustment period and will need to advise if continues to have spotting.  Rx Loestrin 24 Fe see order with instructions  Continue follow up with PCP as  indicated  Pap smear: yes   counseled on breast self exam, mammography screening, adequate intake of calcium and vitamin D, diet and exercise  return annually or prn  An After Visit Summary was printed and given to the patient.

## 2017-09-02 NOTE — Patient Instructions (Signed)
General topics  Next pap or exam is  due in 1 year Take a Women's multivitamin Take 1200 mg. of calcium daily - prefer dietary If any concerns in interim to call back  Breast Self-Awareness Practicing breast self-awareness may pick up problems early, prevent significant medical complications, and possibly save your life. By practicing breast self-awareness, you can become familiar with how your breasts look and feel and if your breasts are changing. This allows you to notice changes early. It can also offer you some reassurance that your breast health is good. One way to learn what is normal for your breasts and whether your breasts are changing is to do a breast self-exam. If you find a lump or something that was not present in the past, it is best to contact your caregiver right away. Other findings that should be evaluated by your caregiver include nipple discharge, especially if it is bloody; skin changes or reddening; areas where the skin seems to be pulled in (retracted); or new lumps and bumps. Breast pain is seldom associated with cancer (malignancy), but should also be evaluated by a caregiver. BREAST SELF-EXAM The best time to examine your breasts is 5 7 days after your menstrual period is over.  ExitCare Patient Information 2013 [employer].   Exercise to Stay Healthy Exercise helps you become and stay healthy. EXERCISE IDEAS AND TIPS Choose exercises that:  You enjoy.  Fit into your day. You do not need to exercise really hard to be healthy. You can do exercises at a slow or medium level and stay healthy. You can:  Stretch before and after working out.  Try yoga, Pilates, or tai chi.  Lift weights.  Walk fast, swim, jog, run, climb stairs, bicycle, dance, or rollerskate.  Take aerobic classes. Exercises that burn about 150 calories:  Running 1  miles in 15 minutes.  Playing volleyball for 45 to 60 minutes.  Washing and waxing a car for 45 to 60  minutes.  Playing touch football for 45 minutes.  Walking 1  miles in 35 minutes.  Pushing a stroller 1  miles in 30 minutes.  Playing basketball for 30 minutes.  Raking leaves for 30 minutes.  Bicycling 5 miles in 30 minutes.  Walking 2 miles in 30 minutes.  Dancing for 30 minutes.  Shoveling snow for 15 minutes.  Swimming laps for 20 minutes.  Walking up stairs for 15 minutes.  Bicycling 4 miles in 15 minutes.  Gardening for 30 to 45 minutes.  Jumping rope for 15 minutes.  Washing windows or floors for 45 to 60 minutes. Document Released: 12/05/2010 Document Revised: 01/25/2012 Document Reviewed: 12/05/2010 ExitCare Patient Information 2013 [employer].   Other topics ( that may be useful information):    Sexually Transmitted Disease Sexually transmitted disease (STD) refers to any infection that is passed from person to person during sexual activity. This may happen by way of saliva, semen, blood, vaginal mucus, or urine. Common STDs include:  Gonorrhea.  Chlamydia.  Syphilis.  HIV/AIDS.  Genital herpes.  Hepatitis B and C.  Trichomonas.  Human papillomavirus (HPV).  Pubic lice. CAUSES  An STD may be spread by bacteria, virus, or parasite. A person can get an STD by:  Sexual intercourse with an infected person.  Sharing sex toys with an infected person.  Sharing needles with an infected person.  Having intimate contact with the genitals, mouth, or rectal areas of an infected person. SYMPTOMS  Some people may not have any symptoms, but   they can still pass the infection to others. Different STDs have different symptoms. Symptoms include:  Painful or bloody urination.  Pain in the pelvis, abdomen, vagina, anus, throat, or eyes.  Skin rash, itching, irritation, growths, or sores (lesions). These usually occur in the genital or anal area.  Abnormal vaginal discharge.  Penile discharge in men.  Soft, flesh-colored skin growths in the  genital or anal area.  Fever.  Pain or bleeding during sexual intercourse.  Swollen glands in the groin area.  Yellow skin and eyes (jaundice). This is seen with hepatitis. DIAGNOSIS  To make a diagnosis, your caregiver may:  Take a medical history.  Perform a physical exam.  Take a specimen (culture) to be examined.  Examine a sample of discharge under a microscope.  Perform blood test TREATMENT   Chlamydia, gonorrhea, trichomonas, and syphilis can be cured with antibiotic medicine.  Genital herpes, hepatitis, and HIV can be treated, but not cured, with prescribed medicines. The medicines will lessen the symptoms.  Genital warts from HPV can be treated with medicine or by freezing, burning (electrocautery), or surgery. Warts may come back.  HPV is a virus and cannot be cured with medicine or surgery.However, abnormal areas may be followed very closely by your caregiver and may be removed from the cervix, vagina, or vulva through office procedures or surgery. If your diagnosis is confirmed, your recent sexual partners need treatment. This is true even if they are symptom-free or have a negative culture or evaluation. They should not have sex until their caregiver says it is okay. HOME CARE INSTRUCTIONS  All sexual partners should be informed, tested, and treated for all STDs.  Take your antibiotics as directed. Finish them even if you start to feel better.  Only take over-the-counter or prescription medicines for pain, discomfort, or fever as directed by your caregiver.  Rest.  Eat a balanced diet and drink enough fluids to keep your urine clear or pale yellow.  Do not have sex until treatment is completed and you have followed up with your caregiver. STDs should be checked after treatment.  Keep all follow-up appointments, Pap tests, and blood tests as directed by your caregiver.  Only use latex condoms and water-soluble lubricants during sexual activity. Do not use  petroleum jelly or oils.  Avoid alcohol and illegal drugs.  Get vaccinated for HPV and hepatitis. If you have not received these vaccines in the past, talk to your caregiver about whether one or both might be right for you.  Avoid risky sex practices that can break the skin. The only way to avoid getting an STD is to avoid all sexual activity.Latex condoms and dental dams (for oral sex) will help lessen the risk of getting an STD, but will not completely eliminate the risk. SEEK MEDICAL CARE IF:   You have a fever.  You have any new or worsening symptoms. Document Released: 01/23/2003 Document Revised: 01/25/2012 Document Reviewed: 01/30/2011 [facility] Patient Information 2013 [employer].    Domestic Abuse You are being battered or abused if someone close to you hits, pushes, or physically hurts you in any way. You also are being abused if you are forced into activities. You are being sexually abused if you are forced to have sexual contact of any kind. You are being emotionally abused if you are made to feel worthless or if you are constantly threatened. It is important to remember that help is available. No one has the right to abuse you. PREVENTION OF FURTHER  ABUSE  Learn the warning signs of danger. This varies with situations but may include: the use of alcohol, threats, isolation from friends and family, or forced sexual contact. Leave if you feel that violence is going to occur.  If you are attacked or beaten, report it to the police so the abuse is documented. You do not have to press charges. The police can protect you while you or the attackers are leaving. Get the officer's name and badge number and a copy of the report.  Find someone you can trust and tell them what is happening to you: your caregiver, a nurse, clergy member, close friend or family member. Feeling ashamed is natural, but remember that you have done nothing wrong. No one deserves abuse. Document Released:  10/30/2000 Document Revised: 01/25/2012 Document Reviewed: 01/08/2011 ExitCare Patient Information 2013 [employer].    How Much is Too Much Alcohol? Drinking too much alcohol can cause injury, accidents, and health problems. These types of problems can include:   Car crashes.  Falls.  Family fighting (domestic violence).  Drowning.  Fights.  Injuries.  Burns.  Damage to certain organs.  Having a baby with birth defects. ONE DRINK CAN BE TOO MUCH WHEN YOU ARE:  Working.  Pregnant or breastfeeding.  Taking medicines. Ask your doctor.  Driving or planning to drive. If you or someone you know has a drinking problem, get help from a doctor.  Document Released: 08/29/2009 Document Revised: 01/25/2012 Document Reviewed: 08/29/2009 ExitCare Patient Information 2013 [employer].   Smoking Hazards Smoking cigarettes is extremely bad for your health. Tobacco smoke has over 200 known poisons in it. There are over 60 chemicals in tobacco smoke that cause cancer. Some of the chemicals found in cigarette smoke include:   Cyanide.  Benzene.  Formaldehyde.  Methanol (wood alcohol).  Acetylene (fuel used in welding torches).  Ammonia. Cigarette smoke also contains the poisonous gases nitrogen oxide and carbon monoxide.  Cigarette smokers have an increased risk of many serious medical problems and Smoking causes approximately:  90% of all lung cancer deaths in men.  80% of all lung cancer deaths in women.  90% of deaths from chronic obstructive lung disease. Compared with nonsmokers, smoking increases the risk of:  Coronary heart disease by 2 to 4 times.  Stroke by 2 to 4 times.  Men developing lung cancer by 23 times.  Women developing lung cancer by 13 times.  Dying from chronic obstructive lung diseases by 12 times.  . Smoking is the most preventable cause of death and disease in our society.  WHY IS SMOKING ADDICTIVE?  Nicotine is the chemical  agent in tobacco that is capable of causing addiction or dependence.  When you smoke and inhale, nicotine is absorbed rapidly into the bloodstream through your lungs. Nicotine absorbed through the lungs is capable of creating a powerful addiction. Both inhaled and non-inhaled nicotine may be addictive.  Addiction studies of cigarettes and spit tobacco show that addiction to nicotine occurs mainly during the teen years, when young people begin using tobacco products. WHAT ARE THE BENEFITS OF QUITTING?  There are many health benefits to quitting smoking.   Likelihood of developing cancer and heart disease decreases. Health improvements are seen almost immediately.  Blood pressure, pulse rate, and breathing patterns start returning to normal soon after quitting. QUITTING SMOKING   American Lung Association - 1-800-LUNGUSA  American Cancer Society - 1-800-ACS-2345 Document Released: 12/10/2004 Document Revised: 01/25/2012 Document Reviewed: 08/14/2009 ExitCare Patient Information 2013 ExitCare,   LLC.   Stress Management Stress is a state of physical or mental tension that often results from changes in your life or normal routine. Some common causes of stress are:  Death of a loved one.  Injuries or severe illnesses.  Getting fired or changing jobs.  Moving into a new home. Other causes may be:  Sexual problems.  Business or financial losses.  Taking on a large debt.  Regular conflict with someone at home or at work.  Constant tiredness from lack of sleep. It is not just bad things that are stressful. It may be stressful to:  Win the lottery.  Get married.  Buy a new car. The amount of stress that can be easily tolerated varies from person to person. Changes generally cause stress, regardless of the types of change. Too much stress can affect your health. It may lead to physical or emotional problems. Too little stress (boredom) may also become stressful. SUGGESTIONS TO  REDUCE STRESS:  Talk things over with your family and friends. It often is helpful to share your concerns and worries. If you feel your problem is serious, you may want to get help from a professional counselor.  Consider your problems one at a time instead of lumping them all together. Trying to take care of everything at once may seem impossible. List all the things you need to do and then start with the most important one. Set a goal to accomplish 2 or 3 things each day. If you expect to do too many in a single day you will naturally fail, causing you to feel even more stressed.  Do not use alcohol or drugs to relieve stress. Although you may feel better for a short time, they do not remove the problems that caused the stress. They can also be habit forming.  Exercise regularly - at least 3 times per week. Physical exercise can help to relieve that "uptight" feeling and will relax you.  The shortest distance between despair and hope is often a good night's sleep.  Go to bed and get up on time allowing yourself time for appointments without being rushed.  Take a short "time-out" period from any stressful situation that occurs during the day. Close your eyes and take some deep breaths. Starting with the muscles in your face, tense them, hold it for a few seconds, then relax. Repeat this with the muscles in your neck, shoulders, hand, stomach, back and legs.  Take good care of yourself. Eat a balanced diet and get plenty of rest.  Schedule time for having fun. Take a break from your daily routine to relax. HOME CARE INSTRUCTIONS   Call if you feel overwhelmed by your problems and feel you can no longer manage them on your own.  Return immediately if you feel like hurting yourself or someone else. Document Released: 04/28/2001 Document Revised: 01/25/2012 Document Reviewed: 12/19/2007 [facility] Patient Information 2013 [employer].   Oral Contraception Use Oral contraceptive pills  (OCPs) are medicines taken to prevent pregnancy. OCPs work by preventing the ovaries from releasing eggs. The hormones in OCPs also cause the cervical mucus to thicken, preventing the sperm from entering the uterus. The hormones also cause the uterine lining to become thin, not allowing a fertilized egg to attach to the inside of the uterus. OCPs are highly effective when taken exactly as prescribed. However, OCPs do not prevent sexually transmitted diseases (STDs). Safe sex practices, such as using condoms along with an OCP, can help prevent STDs.  Before taking OCPs, you may have a physical exam and Pap test. Your health care provider may also order blood tests if necessary. Your health care provider will make sure you are a good candidate for oral contraception. Discuss with your health care provider the possible side effects of the OCP you may be prescribed. When starting an OCP, it can take 2 to 3 months for the body to adjust to the changes in hormone levels in your body. How to take oral contraceptive pills Your health care provider may advise you on how to start taking the first cycle of OCPs. Otherwise, you can:  Start on day 1 of your menstrual period. You will not need any backup contraceptive protection with this start time.  Start on the first Sunday after your menstrual period or the day you get your prescription. In these cases, you will need to use backup contraceptive protection for the first week.  Start the pill at any time of your cycle. If you take the pill within 5 days of the start of your period, you are protected against pregnancy right away. In this case, you will not need a backup form of birth control. If you start at any other time of your menstrual cycle, you will need to use another form of birth control for 7 days. If your OCP is the type called a minipill, it will protect you from pregnancy after taking it for 2 days (48 hours).  After you have started taking OCPs:  If  you forget to take 1 pill, take it as soon as you remember. Take the next pill at the regular time.  If you miss 2 or more pills, call your health care provider because different pills have different instructions for missed doses. Use backup birth control until your next menstrual period starts.  If you use a 28-day pack that contains inactive pills and you miss 1 of the last 7 pills (pills with no hormones), it will not matter. Throw away the rest of the non-hormone pills and start a new pill pack.  No matter which day you start the OCP, you will always start a new pack on that same day of the week. Have an extra pack of OCPs and a backup contraceptive method available in case you miss some pills or lose your OCP pack. Follow these instructions at home:  Do not smoke.  Always use a condom to protect against STDs. OCPs do not protect against STDs.  Use a calendar to mark your menstrual period days.  Read the information and directions that came with your OCP. Talk to your health care provider if you have questions. Contact a health care provider if:  You develop nausea and vomiting.  You have abnormal vaginal discharge or bleeding.  You develop a rash.  You miss your menstrual period.  You are losing your hair.  You need treatment for mood swings or depression.  You get dizzy when taking the OCP.  You develop acne from taking the OCP.  You become pregnant. Get help right away if:  You develop chest pain.  You develop shortness of breath.  You have an uncontrolled or severe headache.  You develop numbness or slurred speech.  You develop visual problems.  You develop pain, redness, and swelling in the legs. This information is not intended to replace advice given to you by your health care provider. Make sure you discuss any questions you have with your health care provider. Document Released: 10/22/2011  Document Revised: 04/09/2016 Document Reviewed:  04/23/2013 Elsevier Interactive Patient Education  2017 [employer] Inc.  

## 2017-09-03 LAB — CYTOLOGY - PAP: DIAGNOSIS: NEGATIVE

## 2017-09-09 ENCOUNTER — Other Ambulatory Visit: Payer: Self-pay | Admitting: Certified Nurse Midwife

## 2017-09-09 DIAGNOSIS — Z3041 Encounter for surveillance of contraceptive pills: Secondary | ICD-10-CM

## 2017-10-26 ENCOUNTER — Other Ambulatory Visit: Payer: Self-pay | Admitting: Family Medicine

## 2017-10-26 DIAGNOSIS — Z1231 Encounter for screening mammogram for malignant neoplasm of breast: Secondary | ICD-10-CM

## 2017-10-27 ENCOUNTER — Ambulatory Visit: Payer: 59

## 2017-11-25 ENCOUNTER — Ambulatory Visit
Admission: RE | Admit: 2017-11-25 | Discharge: 2017-11-25 | Disposition: A | Discharge status: Home / Self Care | Payer: 59 | Source: Ambulatory Visit | Attending: Family Medicine | Admitting: Family Medicine

## 2017-11-25 DIAGNOSIS — Z1231 Encounter for screening mammogram for malignant neoplasm of breast: Secondary | ICD-10-CM

## 2017-12-29 ENCOUNTER — Other Ambulatory Visit: Payer: Self-pay | Admitting: Family Medicine

## 2017-12-29 DIAGNOSIS — R197 Diarrhea, unspecified: Secondary | ICD-10-CM

## 2017-12-29 DIAGNOSIS — R1010 Upper abdominal pain, unspecified: Secondary | ICD-10-CM

## 2018-01-04 ENCOUNTER — Other Ambulatory Visit: Payer: 59

## 2018-01-07 ENCOUNTER — Other Ambulatory Visit: Payer: 59

## 2018-07-05 ENCOUNTER — Telehealth: Payer: Self-pay | Admitting: Certified Nurse Midwife

## 2018-07-05 NOTE — Telephone Encounter (Signed)
Patient is asking to tal with a nurse about her loestrin. Patient states "it is not working for her" and would like to make a change.

## 2018-07-05 NOTE — Telephone Encounter (Signed)
Reviewed with Melvia Heaps, CNM, OV recommended.   Call returned to patient, OV scheduled for 8/21 at 8am with Melvia Heaps, CNM. Patient verbalizes understanding and is agreeable. Encounter closed.

## 2018-07-05 NOTE — Telephone Encounter (Signed)
Spoke with patient. Patient reports menses is 2 wks late. LMP 6wks ago, unsure of exact date. Had knee surgery in May, did not stop OCP. Denies missed or late pills. Denies any other symptoms. SA, has not taken UPT. Has been on Loestrin since 08/2017, requesting to switch OCP. States she has tried several different OCP, prefers pill. States menses was "doing well until knee surgery".   Recommended patient take UPT to r/o pregnancy. Will review with Melvia Heaps, CNM and return call with additional recommendations. Patient agreeable.   Melvia Heaps, CNM -please advise.

## 2018-07-06 ENCOUNTER — Ambulatory Visit (INDEPENDENT_AMBULATORY_CARE_PROVIDER_SITE_OTHER): Payer: 59 | Admitting: Certified Nurse Midwife

## 2018-07-06 ENCOUNTER — Other Ambulatory Visit: Payer: Self-pay

## 2018-07-06 ENCOUNTER — Encounter: Payer: Self-pay | Admitting: Certified Nurse Midwife

## 2018-07-06 VITALS — BP 104/64 | HR 64 | Resp 16 | Ht 66.75 in | Wt 142.0 lb

## 2018-07-06 DIAGNOSIS — Z3202 Encounter for pregnancy test, result negative: Secondary | ICD-10-CM | POA: Diagnosis not present

## 2018-07-06 DIAGNOSIS — N912 Amenorrhea, unspecified: Secondary | ICD-10-CM

## 2018-07-06 DIAGNOSIS — Z3041 Encounter for surveillance of contraceptive pills: Secondary | ICD-10-CM | POA: Diagnosis not present

## 2018-07-06 LAB — POCT URINE PREGNANCY: PREG TEST UR: NEGATIVE

## 2018-07-06 MED ORDER — NORETHIN ACE-ETH ESTRAD-FE 1-20 MG-MCG PO TABS: 1.0000 | ORAL_TABLET | Freq: Every day | ORAL | 5 refills | Status: DC

## 2018-07-06 NOTE — Progress Notes (Signed)
27 y.o. married white female g0p0  presents with amenorrhea. LMP 05/25/18. Patient using OCP and reports no missed pills. Had knee surgery (L) 04/15/18 did not stop pills during that time. Periods normal until no period for 8/19. She does relate that she has  been starting her period on last week of active pills and not on placebo week over the past few months. Patient would like to change OCP . Denies warning signs with pills, weight change or illness, which can affect periods.. Hodgkin lymphoma still in remission, and no other health issues.. No other health issues or problems. Taking OCP regular and just concerned, has never  missed period before now. Denies any other symptoms or abdominal or pelvic pain or health concerns.   Review of Systems  Constitutional: Negative.   HENT: Negative.   Eyes: Negative.   Respiratory: Negative.   Cardiovascular: Negative.   Gastrointestinal: Negative.   Genitourinary:       Menstrual cycle changes, loss of sexual interest  Musculoskeletal: Negative.   Skin: Negative.   Neurological: Negative.   Endo/Heme/Allergies: Negative.   Psychiatric/Behavioral: Negative.     O: HPI pertinent to above. Healthy [facility] female Affect: normal, orientation x 3  UPT today-neg  Physical Exam  Constitutional: She is oriented to person, place, and time. She appears well-developed and well-nourished.  Neck: No thyromegaly present.  Abdominal: Soft. She exhibits no mass. There is no tenderness.  Genitourinary: Rectum normal, vagina normal and uterus normal. Pelvic exam was performed with patient supine. There is no rash, tenderness or lesion on the right labia. There is no rash, tenderness or lesion on the left labia. Cervix exhibits no motion tenderness and no discharge. Right adnexum displays no mass, no tenderness and no fullness. Left adnexum displays no mass, no tenderness and no fullness. No tenderness in the vagina. No vaginal discharge found.  Lymphadenopathy: No  inguinal adenopathy noted on the right or left side.       Right: No inguinal adenopathy present.       Left: No inguinal adenopathy present.  Neurological: She is alert and oriented to person, place, and time.  Skin: Skin is warm and dry.  Psychiatric: She has a normal mood and affect. Her behavior is normal. Judgment and thought content normal.   A: Normal pelvic exam UPT here negative OCP amenorrhea, desires pill change R/O thyroid change  P: Discussed normal pelvic exam and no enlarged thyroid noted.  Discussed amenorrhea not uncommon with OCP use, but with cycles occurring during 3rd week of pack concerns for adequate protection for pregnancy. Discussed changing to Loestrin 1/20 Fe, patient agreeable.  Rx Loestrin 1/20 Fe see order with instructions. Start on first day of next period. Complete pack she is on at present. Keep menstrual calendar and advise if period does not occur. Use condoms during first month of use due to change in cycle. Questions addressed. Lab TSH  Rv prn

## 2018-07-06 NOTE — Patient Instructions (Signed)
Oral Contraception Use Oral contraceptive pills (OCPs) are medicines taken to prevent pregnancy. OCPs work by preventing the ovaries from releasing eggs. The hormones in OCPs also cause the cervical mucus to thicken, preventing the sperm from entering the uterus. The hormones also cause the uterine lining to become thin, not allowing a fertilized egg to attach to the inside of the uterus. OCPs are highly effective when taken exactly as prescribed. However, OCPs do not prevent sexually transmitted diseases (STDs). Safe sex practices, such as using condoms along with an OCP, can help prevent STDs. Before taking OCPs, you may have a physical exam and Pap test. Your health care provider may also order blood tests if necessary. Your health care provider will make sure you are a good candidate for oral contraception. Discuss with your health care provider the possible side effects of the OCP you may be prescribed. When starting an OCP, it can take 2 to 3 months for the body to adjust to the changes in hormone levels in your body. How to take oral contraceptive pills Your health care provider may advise you on how to start taking the first cycle of OCPs. Otherwise, you can:  Start on day 1 of your menstrual period. You will not need any backup contraceptive protection with this start time.  Start on the first Sunday after your menstrual period or the day you get your prescription. In these cases, you will need to use backup contraceptive protection for the first week.  Start the pill at any time of your cycle. If you take the pill within 5 days of the start of your period, you are protected against pregnancy right away. In this case, you will not need a backup form of birth control. If you start at any other time of your menstrual cycle, you will need to use another form of birth control for 7 days. If your OCP is the type called a minipill, it will protect you from pregnancy after taking it for 2 days (48  hours).  After you have started taking OCPs:  If you forget to take 1 pill, take it as soon as you remember. Take the next pill at the regular time.  If you miss 2 or more pills, call your health care provider because different pills have different instructions for missed doses. Use backup birth control until your next menstrual period starts.  If you use a 28-day pack that contains inactive pills and you miss 1 of the last 7 pills (pills with no hormones), it will not matter. Throw away the rest of the non-hormone pills and start a new pill pack.  No matter which day you start the OCP, you will always start a new pack on that same day of the week. Have an extra pack of OCPs and a backup contraceptive method available in case you miss some pills or lose your OCP pack. Follow these instructions at home:  Do not smoke.  Always use a condom to protect against STDs. OCPs do not protect against STDs.  Use a calendar to mark your menstrual period days.  Read the information and directions that came with your OCP. Talk to your health care provider if you have questions. Contact a health care provider if:  You develop nausea and vomiting.  You have abnormal vaginal discharge or bleeding.  You develop a rash.  You miss your menstrual period.  You are losing your hair.  You need treatment for mood swings or depression.  You   get dizzy when taking the OCP.  You develop acne from taking the OCP.  You become pregnant. Get help right away if:  You develop chest pain.  You develop shortness of breath.  You have an uncontrolled or severe headache.  You develop numbness or slurred speech.  You develop visual problems.  You develop pain, redness, and swelling in the legs. This information is not intended to replace advice given to you by your health care provider. Make sure you discuss any questions you have with your health care provider. Document Released: 10/22/2011 Document  Revised: 04/09/2016 Document Reviewed: 04/23/2013 Elsevier Interactive Patient Education  2017 [employer].  

## 2018-07-07 LAB — TSH: TSH: 3.19 u[IU]/mL (ref 0.450–4.500)

## 2018-09-19 ENCOUNTER — Other Ambulatory Visit: Payer: Self-pay | Admitting: Family Medicine

## 2018-09-19 DIAGNOSIS — R1900 Intra-abdominal and pelvic swelling, mass and lump, unspecified site: Secondary | ICD-10-CM

## 2018-09-21 NOTE — Progress Notes (Signed)
27 y.o. G0P0000 Married  Caucasian Fe here for annual exam. Changed OCP to Loestrin and had slight BTB with first pack, on second pack with some BTB, but has not missed any OCP. Not sleeping well, using Zquell as needed. 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 Still not released from [facility] after left knee surgery, but doing well. Sees [facility] for aex, prn. No other health concerns today..  Patient's last menstrual period was 09/20/2018.          Sexually active: Yes.    The current method of family planning is OCP (estrogen/progesterone).    Exercising: Yes.    cardio, weights Smoker:  no  Review of Systems  Constitutional: Negative.   HENT: Negative.   Eyes: Negative.   Respiratory: Negative.   Gastrointestinal: Negative.   Genitourinary:       Menstrual cycle changes Unscheduled bleeding or spotting   Musculoskeletal: Negative.   Skin: Negative.   Neurological: Negative.   Endo/Heme/Allergies: Negative.   Psychiatric/Behavioral: Negative.     Health Maintenance: Pap:  06-21-14 neg, 09-02-17 neg History of Abnormal Pap: no MMG:  11-25-17 category d density birads 1:neg, scheduled for 09-23-18  Self Breast exams: yes Colonoscopy:  none BMD:   none TDaP:  09-19-18 with PCP Shingles: no Pneumonia: no Hep C and HIV: done with PCP Labs: PCP   reports that she has never smoked. She has never used smokeless tobacco. She reports that she drinks about 1.0 - 2.0 standard drinks of alcohol per week. She reports that she does not use drugs.  Past Medical History:  Diagnosis Date  . Asthma   . Hodgkin lymphoma ([place])    chemo, radiation. completed 8/09. treated @ [facility] stage II    Past Surgical History:  Procedure Laterality Date  . DEEP NECK LYMPH NODE BIOPSY / EXCISION    . KNEE SURGERY     acl repair X 3    Current Outpatient Medications  Medication Sig Dispense Refill  . montelukast (SINGULAIR) 10 MG tablet Take 10 mg by mouth daily.  3  . Multiple  Vitamins-Minerals (MULTIVITAMIN PO) Take by mouth daily.    . norethindrone-ethinyl estradiol (JUNEL FE,GILDESS FE,LOESTRIN FE) 1-20 MG-MCG tablet Take 1 tablet by mouth daily. 1 Package 5  . omeprazole (PRILOSEC) 40 MG capsule TAKE 1 CAPSULE (40 MG) BY MOUTH ONE TIME DAILY  2  . PROAIR RESPICLICK 277 (90 Base) MCG/ACT AEPB INHALE 2 PUFFS DAILY 15 TO 30 MINUTES BEFORE EXERCISE AS NEEDED/ BEFORE EXPOSURES IF NEEDED  6   No current facility-administered medications for this visit.     Family History  Problem Relation Age of Onset  . Thyroid disease Mother 62       thyroidectomy  . Diabetes Father   . Diabetes Paternal Aunt   . Diabetes Paternal Grandmother     ROS:  Pertinent items are noted in HPI.  Otherwise, a comprehensive ROS was negative.  Exam:   BP 118/84 (BP Location: Right Arm, Patient Position: Sitting, Cuff Size: Normal)   Pulse 78   Resp 14   Ht 5' 7.5" (1.715 m)   Wt 143 lb 1.6 oz (64.9 kg)   LMP 09/20/2018   BMI 22.08 kg/m  Height: 5' 7.5" (171.5 cm) Ht Readings from Last 3 Encounters:  09/22/18 5' 7.5" (1.715 m)  07/06/18 5' 6.75" (1.695 m)  09/02/17 5' 6.75" (1.695 m)    General appearance: alert, cooperative and appears stated age Head: Normocephalic, without obvious abnormality, atraumatic  Neck: no adenopathy, supple, symmetrical, trachea midline and thyroid normal to inspection and palpation Lungs: clear to auscultation bilaterally Breasts: normal appearance, no masses or tenderness, No nipple retraction or dimpling, No nipple discharge or bleeding, No axillary or supraclavicular adenopathy Heart: regular rate and rhythm Abdomen: soft, non-tender; no masses,  no organomegaly Extremities: extremities normal, atraumatic, no cyanosis or edema Skin: Skin color, texture, turgor normal. No rashes or lesions Lymph nodes: Cervical, supraclavicular, and axillary nodes normal. No abnormal inguinal nodes palpated Neurologic: Grossly normal   Pelvic: External  genitalia:  no lesions              Urethra:  normal appearing urethra with no masses, tenderness or lesions              Bartholin's and Skene's: normal                 Vagina: normal appearing vagina with normal color and discharge, no lesions              Cervix: no cervical motion tenderness, no lesions and nulliparous appearance              Pap taken: No. Bimanual Exam:  Uterus:  normal size, contour, position, consistency, mobility, non-tender and anteverted              Adnexa: normal adnexa and no mass, fullness, tenderness               Rectovaginal: Confirms               Anus:  normal appearance, no lesions  Chaperone present: yes  A:  Well Woman with normal exam  Contraception OCP desired in adjustment period with use some spotting, no warning signs noted  P:   Reviewed health and wellness pertinent to exam  Discussed BTB and may need to increase dosage for better endometrial support, patient aware and will advise if continues after the next month. Warning signs reviewed. Stressed importance of consistent use.  Pap smear: no   counseled on breast self exam, use and side effects of OCP's, adequate intake of calcium and vitamin D, diet and exercise  return annually or prn  An After Visit Summary was printed and given to the patient.

## 2018-09-22 ENCOUNTER — Other Ambulatory Visit: Payer: Self-pay

## 2018-09-22 ENCOUNTER — Ambulatory Visit (INDEPENDENT_AMBULATORY_CARE_PROVIDER_SITE_OTHER): Payer: 59 | Admitting: Certified Nurse Midwife

## 2018-09-22 ENCOUNTER — Encounter: Payer: Self-pay | Admitting: Certified Nurse Midwife

## 2018-09-22 VITALS — BP 118/84 | HR 78 | Resp 14 | Ht 67.5 in | Wt 143.1 lb

## 2018-09-22 DIAGNOSIS — Z3041 Encounter for surveillance of contraceptive pills: Secondary | ICD-10-CM

## 2018-09-22 DIAGNOSIS — Z01419 Encounter for gynecological examination (general) (routine) without abnormal findings: Secondary | ICD-10-CM

## 2018-09-22 NOTE — Patient Instructions (Signed)

## 2018-09-23 ENCOUNTER — Ambulatory Visit
Admission: RE | Admit: 2018-09-23 | Discharge: 2018-09-23 | Disposition: A | Discharge status: Home / Self Care | Payer: 59 | Source: Ambulatory Visit | Attending: Family Medicine | Admitting: Family Medicine

## 2018-09-23 DIAGNOSIS — R1900 Intra-abdominal and pelvic swelling, mass and lump, unspecified site: Secondary | ICD-10-CM

## 2018-09-23 HISTORY — DX: Personal history of irradiation: Z92.3

## 2018-12-14 ENCOUNTER — Other Ambulatory Visit: Payer: Self-pay | Admitting: Certified Nurse Midwife

## 2018-12-14 DIAGNOSIS — Z3041 Encounter for surveillance of contraceptive pills: Secondary | ICD-10-CM

## 2018-12-14 NOTE — Telephone Encounter (Signed)
Message left to return call to [facility] at [phone].   Patient needs to give update of bleeding on current dosage of OCP.

## 2018-12-15 NOTE — Telephone Encounter (Signed)
Patient returned call. Patient states "what I'm on is not working." States she started her period on 12-14-2018 and just had one ~2 weeks ago. Patient states she is mid-pack of her OCP. OV recommended to discuss with Debbi. Patient agreeable. Patient scheduled for 12-16-2018 at [facility]. Patient agreeable to date and time of appointment. Aware RN will deny request for refill of OCP to pharmacy. Patient agreeable.   Routing to provider and will close encounter.

## 2018-12-16 ENCOUNTER — Encounter: Payer: Self-pay | Admitting: Certified Nurse Midwife

## 2018-12-16 ENCOUNTER — Other Ambulatory Visit: Payer: Self-pay

## 2018-12-16 ENCOUNTER — Ambulatory Visit (INDEPENDENT_AMBULATORY_CARE_PROVIDER_SITE_OTHER): Payer: 59 | Admitting: Certified Nurse Midwife

## 2018-12-16 VITALS — BP 120/80 | HR 68 | Resp 16 | Wt 144.0 lb

## 2018-12-16 DIAGNOSIS — Z3041 Encounter for surveillance of contraceptive pills: Secondary | ICD-10-CM

## 2018-12-16 NOTE — Progress Notes (Signed)
ROS  28 y.o. Married Caucasian G0P0000 here for evaluation of Loestrin  initiated on 07/06/18 for contraception.. She continues to have BTB with consistent use. Now feeling she just wants to stop OCP for a few months and see if cycles return to normal. Declines exam,here for consultation only. Menses duration 5 days with small to moderate  flow. Patient taking medication as prescribed. Denies missed pills, headaches, nausea, DVT warning signs or symptoms,   or other changes.   Keeping menses calendar. No other health issues today.  O: Healthy female, WD WN Affect: normal orientation X 3  Thyroid normal, no nodules or enlargement   A: Contraception follow up of Loestrin 24 fe Breakthrough bleeding with consistent use. Previous normal exam.  Previous Lab for thyroid normal  P: Discussed other contraception option of Nexplanon, IUD, Nuvaring, Depo Provera risks/benefits/and insertion/removal. Given printed information on options. Patient prefers to stop at this time with condom use. May want to restart OCP, when less stress with work. If used OCP again would increase dose to help with better endometrial support. Patient will advise if abnormal bleeding if need to restart OCP. Questions addressed.   20 minutes spent with patient in face to face counseling regarding contraception.  RV prn

## 2019-05-08 IMAGING — MG DIGITAL DIAGNOSTIC UNILATERAL RIGHT MAMMOGRAM WITH TOMO AND CAD
6 series · 6 of 18 positions shown · non-contrast
Comparison: Previous exam(s).

CLINICAL DATA: 26-year-old female with history of Hodgkin's
lymphoma with radiation to the chest in 5114. Patient's physician
palpates an abnormality in the 9 o'clock region of the right breast.

EXAM:
DIGITAL DIAGNOSTIC RIGHT MAMMOGRAM WITH CAD AND TOMO
ULTRASOUND RIGHT BREAST

[R MLO synth-2D (1 of 2)]
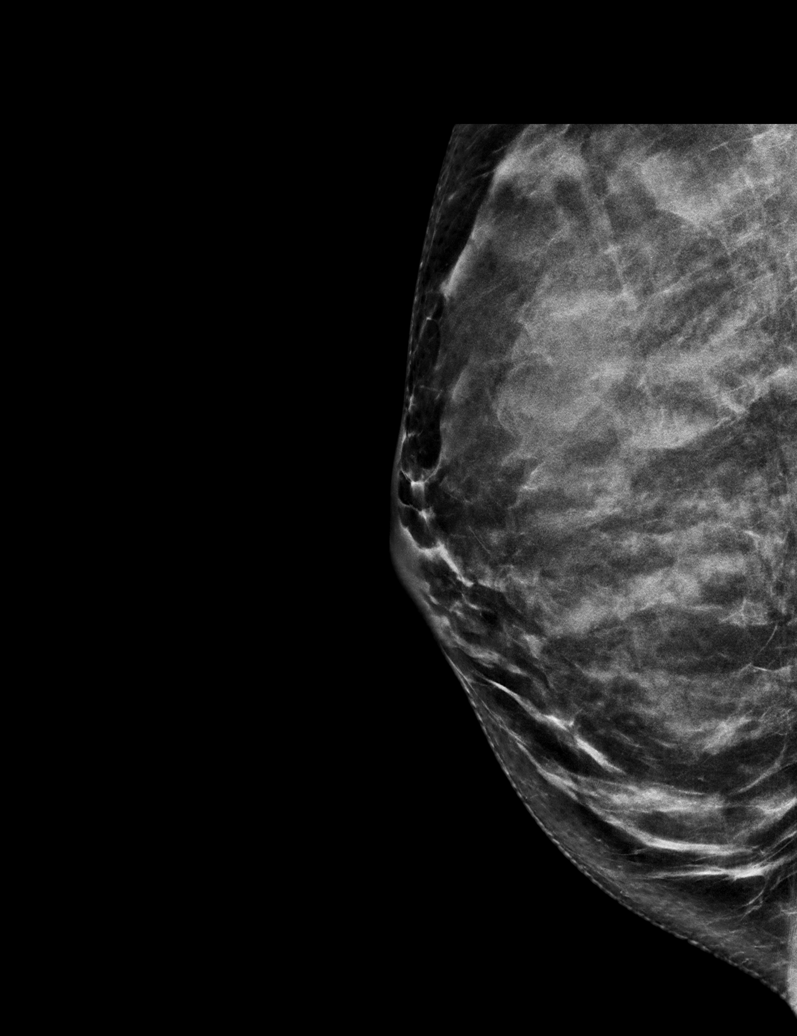

[R MLO synth-2D (2 of 2)]
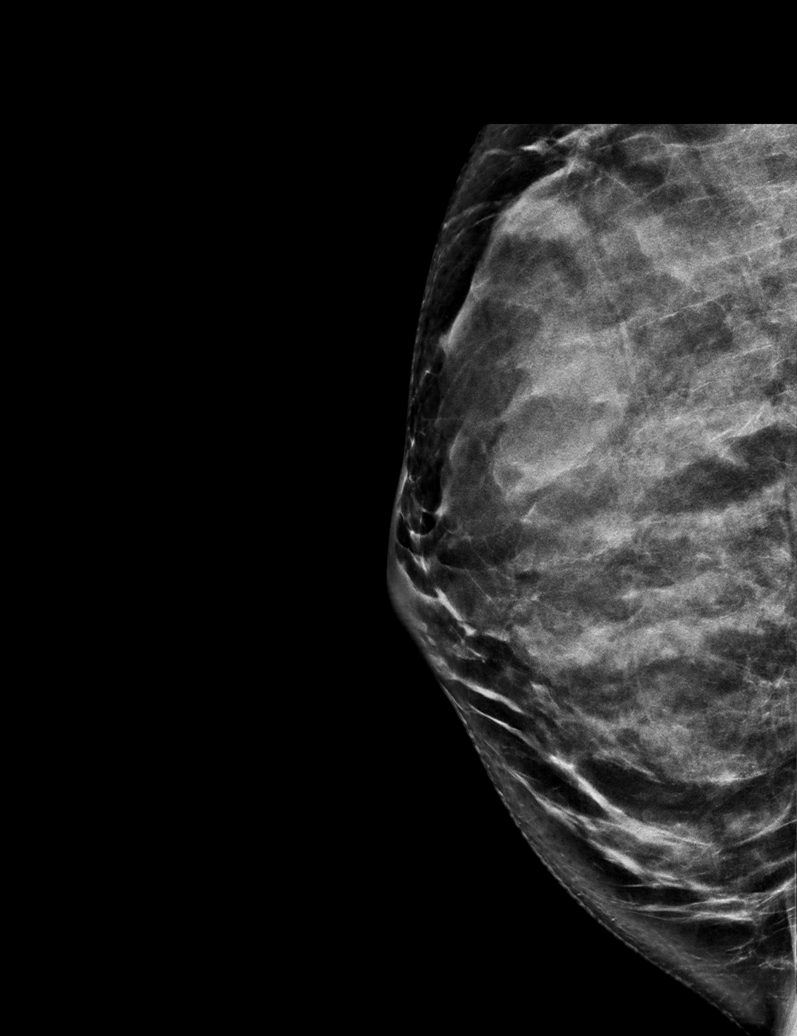

[R CC synth-2D]
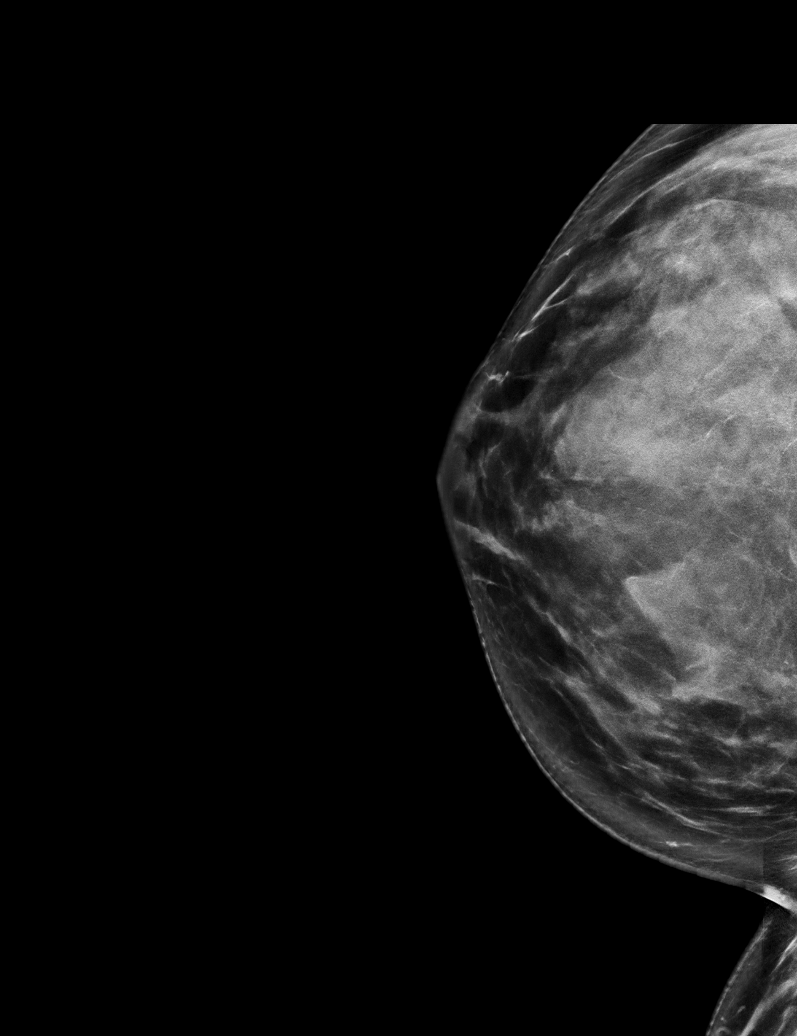

[R MLO tomo (1 of 2) · tomo slice 32/63.0]
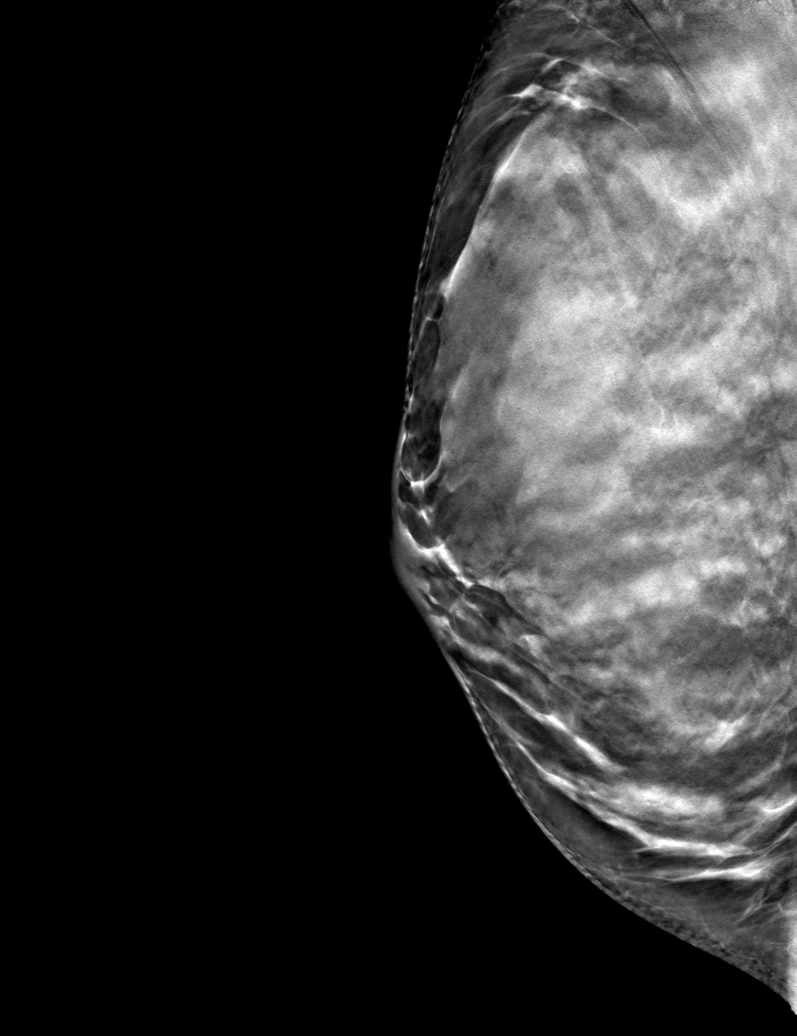

[R MLO tomo (2 of 2) · tomo slice 33/66.0]
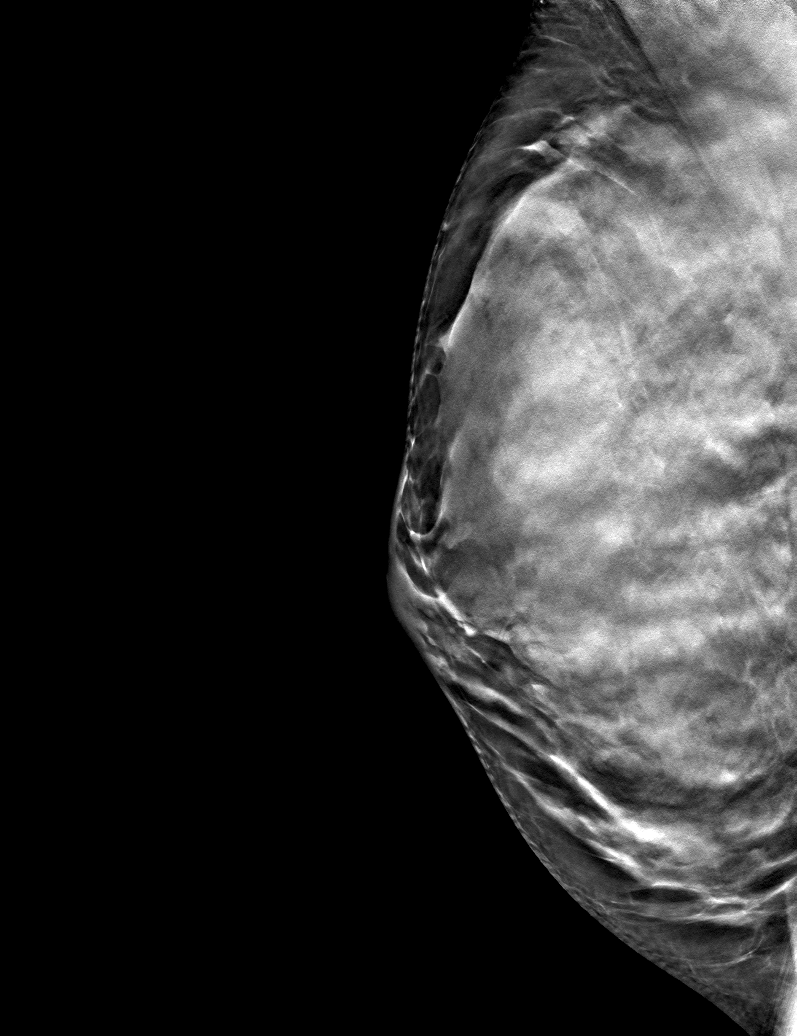

[R CC tomo · tomo slice 40/79.0]
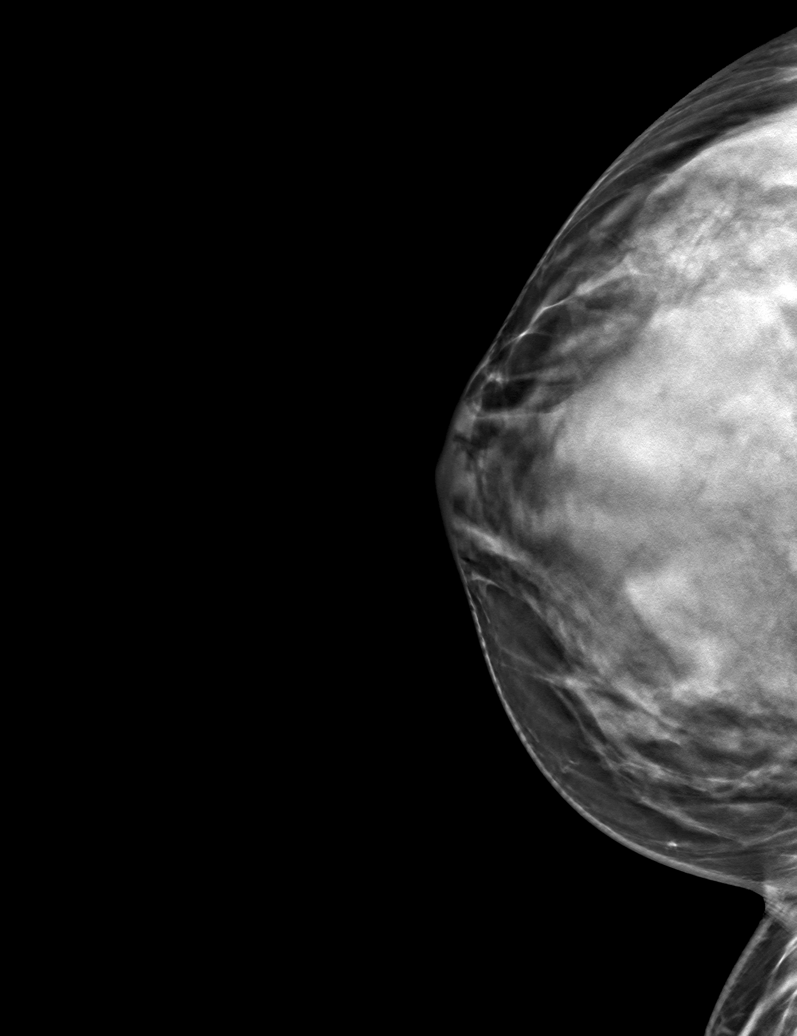

[6 of 18 positions shown; findings below may reference images not displayed]

ACR Breast Density Category d: The breast tissue is extremely dense,
which lowers the sensitivity of mammography.
FINDINGS: No suspicious mass, malignant type microcalcifications or distortion
detected in the right breast.

Mammographic images were processed with CAD.

On physical exam, I do not palpate a mass in the lateral aspect of
the right breast.

Targeted ultrasound is performed, showing normal tissue in the
lateral aspect of the right breast. No solid or cystic mass,
abnormal shadowing or distortion visualized.
IMPRESSION: No evidence of malignancy in the right breast.

RECOMMENDATION:
Bilateral screening mammogram in Friday November, 2018 is recommended.
Breast MRI should be considered in this patient with a history of
radiation to the chest.

The American Cancer Society recommends annual MRI and mammography in
patients with an estimated lifetime risk of developing breast cancer
greater than 20 - 25%, or who are known or suspected to be positive
for the breast cancer gene.

I have discussed the findings and recommendations with the patient.
Results were also provided in writing at the conclusion of the
visit. If applicable, a reminder letter will be sent to the patient
regarding the next appointment.

BI-RADS CATEGORY  1: Negative.

## 2019-09-21 ENCOUNTER — Other Ambulatory Visit: Payer: Self-pay

## 2019-09-25 ENCOUNTER — Ambulatory Visit: Payer: 59 | Admitting: Certified Nurse Midwife

## 2019-09-28 ENCOUNTER — Ambulatory Visit: Payer: 59 | Admitting: Certified Nurse Midwife

## 2019-09-28 ENCOUNTER — Other Ambulatory Visit: Payer: Self-pay

## 2019-09-29 NOTE — Progress Notes (Signed)
28 y.o. G0P0000 Married  Caucasian Fe here for annual exam. Has stopped OCP and using condoms for contraception now. Sees PCP Dr. Ernie Hew prn, not this year. No surgeries this year ! No reoccurrence of Hodgkin disease. May try for pregnancy next year. No health issues today.  No LMP recorded.          Sexually active: Yes.    The current method of family planning is condoms all the time.    Exercising: Yes.    pilates, HIIT workout Smoker:  no  Review of Systems  Constitutional: Negative.   HENT: Negative.   Eyes: Negative.   Respiratory: Negative.   Cardiovascular: Negative.   Gastrointestinal: Negative.   Genitourinary: Negative.   Musculoskeletal: Negative.   Skin: Negative.   Neurological: Negative.   Endo/Heme/Allergies: Negative.   Psychiatric/Behavioral: Negative.     Health Maintenance: Pap:  09-02-17 neg History of Abnormal Pap: no MMG: bilateral 1/19, Rt breast 09/23/18 category d density birads 1:neg Self Breast exams: yes Colonoscopy:  none BMD:   none TDaP:  2019 Shingles: no Pneumonia: no Hep C and HIV: unsure Labs: if needed   reports that she has never smoked. She has never used smokeless tobacco. She reports current alcohol use of about 1.0 - 2.0 standard drinks of alcohol per week. She reports that she does not use drugs.  Past Medical History:  Diagnosis Date  . Asthma   . Hodgkin lymphoma ([place])    chemo, radiation. completed 8/09. treated @ [facility] stage [facility]  . Personal history of radiation therapy 2009   Hodgkin's Lymphoma    Past Surgical History:  Procedure Laterality Date  . DEEP NECK LYMPH NODE BIOPSY / EXCISION    . KNEE SURGERY     acl repair X 3    Current Outpatient Medications  Medication Sig Dispense Refill  . montelukast (SINGULAIR) 10 MG tablet Take 10 mg by mouth daily.  3  . Multiple Vitamins-Minerals (MULTIVITAMIN PO) Take by mouth daily.    . norethindrone-ethinyl estradiol (JUNEL FE,GILDESS FE,LOESTRIN FE) 1-20 MG-MCG tablet  Take 1 tablet by mouth daily. 1 Package 5  . PROAIR RESPICLICK 123XX123 (90 Base) MCG/ACT AEPB INHALE 2 PUFFS DAILY 15 TO 30 MINUTES BEFORE EXERCISE AS NEEDED/ BEFORE EXPOSURES IF NEEDED  6   No current facility-administered medications for this visit.     Family History  Problem Relation Age of Onset  . Thyroid disease Mother 20       thyroidectomy  . Diabetes Father   . Diabetes Paternal Aunt   . Diabetes Paternal Grandmother     ROS:  Pertinent items are noted in HPI.  Otherwise, a comprehensive ROS was negative.  Exam:   There were no vitals taken for this visit.   Ht Readings from Last 3 Encounters:  09/22/18 5' 7.5" (1.715 m)  07/06/18 5' 6.75" (1.695 m)  09/02/17 5' 6.75" (1.695 m)    General appearance: alert, cooperative and appears stated age Head: Normocephalic, without obvious abnormality, atraumatic Neck: no adenopathy, supple, symmetrical, trachea midline and thyroid normal to inspection and palpation Lungs: clear to auscultation bilaterally Breasts: normal appearance, no masses or tenderness, No nipple retraction or dimpling, No nipple discharge or bleeding, No axillary or supraclavicular adenopathy Heart: regular rate and rhythm Abdomen: soft, non-tender; no masses,  no organomegaly Extremities: extremities normal, atraumatic, no cyanosis or edema Skin: Skin color, texture, turgor normal. No rashes or lesions Lymph nodes: Cervical, supraclavicular, and axillary nodes normal. No abnormal inguinal nodes  palpated Neurologic: Grossly normal   Pelvic: External genitalia:  no lesions              Urethra:  normal appearing urethra with no masses, tenderness or lesions              Bartholin's and Skene's: normal                 Vagina: normal appearing vagina with normal color and discharge, no lesions              Cervix: no cervical motion tenderness, no lesions and nulliparous appearance              Pap taken: No. Bimanual Exam:  Uterus:  normal size, contour,  position, consistency, mobility, non-tender and anteverted              Adnexa: normal adnexa and no mass, fullness, tenderness               Rectovaginal: Confirms               Anus:  normal sphincter tone, no lesions  Chaperone present: yes  A:  Well Woman with normal exam  Contraception condoms, stopped OCP, happy with choice  May plan pregnancy in next year.  History of Hodgkin Lymphoma  P:   Reviewed health and wellness pertinent to exam  Discussed menstrual cycles may vary now that she is off OCP and may note ovulatory cramping( she has) again.  Discussed pregnancy planning and checking Rubella immune status, so if needed can re-immunize. Given printed information and questions answered. Discussed starting prenatal vitamins prior to conception.  Lab: Rubella  Continue follow up as indicated.  Pap smear: no   counseled on breast self exam, mammography screening, feminine hygiene, adequate intake of calcium and vitamin D, diet and exercise  return annually or prn  An After Visit Summary was printed and given to the patient.

## 2019-10-02 ENCOUNTER — Other Ambulatory Visit: Payer: Self-pay

## 2019-10-02 ENCOUNTER — Ambulatory Visit (INDEPENDENT_AMBULATORY_CARE_PROVIDER_SITE_OTHER): Payer: 59 | Admitting: Certified Nurse Midwife

## 2019-10-02 ENCOUNTER — Encounter: Payer: Self-pay | Admitting: Certified Nurse Midwife

## 2019-10-02 VITALS — BP 110/70 | HR 64 | Temp 97.1°F | Resp 16 | Ht 66.75 in | Wt 141.0 lb

## 2019-10-02 DIAGNOSIS — Z8571 Personal history of Hodgkin lymphoma: Secondary | ICD-10-CM | POA: Diagnosis not present

## 2019-10-02 DIAGNOSIS — Z01419 Encounter for gynecological examination (general) (routine) without abnormal findings: Secondary | ICD-10-CM

## 2019-10-02 DIAGNOSIS — Z789 Other specified health status: Secondary | ICD-10-CM | POA: Diagnosis not present

## 2019-10-02 NOTE — Patient Instructions (Signed)
General topics  Next pap or exam is  due in 1 year Take a Women's multivitamin Take 1200 mg. of calcium daily - prefer dietary If any concerns in interim to call back  Breast Self-Awareness Practicing breast self-awareness may pick up problems early, prevent significant medical complications, and possibly save your life. By practicing breast self-awareness, you can become familiar with how your breasts look and feel and if your breasts are changing. This allows you to notice changes early. It can also offer you some reassurance that your breast health is good. One way to learn what is normal for your breasts and whether your breasts are changing is to do a breast self-exam. If you find a lump or something that was not present in the past, it is best to contact your caregiver right away. Other findings that should be evaluated by your caregiver include nipple discharge, especially if it is bloody; skin changes or reddening; areas where the skin seems to be pulled in (retracted); or new lumps and bumps. Breast pain is seldom associated with cancer (malignancy), but should also be evaluated by a caregiver. BREAST SELF-EXAM The best time to examine your breasts is 5 7 days after your menstrual period is over.  ExitCare Patient Information 2013 [place].   Exercise to Stay Healthy Exercise helps you become and stay healthy. EXERCISE IDEAS AND TIPS Choose exercises that:  You enjoy.  Fit into your day. You do not need to exercise really hard to be healthy. You can do exercises at a slow or medium level and stay healthy. You can:  Stretch before and after working out.  Try yoga, Pilates, or tai chi.  Lift weights.  Walk fast, swim, jog, run, climb stairs, bicycle, dance, or rollerskate.  Take aerobic classes. Exercises that burn about 150 calories:  Running 1  miles in 15 minutes.  Playing volleyball for 45 to 60 minutes.  Washing and waxing a car for 45 to 60  minutes.  Playing touch football for 45 minutes.  Walking 1  miles in 35 minutes.  Pushing a stroller 1  miles in 30 minutes.  Playing basketball for 30 minutes.  Raking leaves for 30 minutes.  Bicycling 5 miles in 30 minutes.  Walking 2 miles in 30 minutes.  Dancing for 30 minutes.  Shoveling snow for 15 minutes.  Swimming laps for 20 minutes.  Walking up stairs for 15 minutes.  Bicycling 4 miles in 15 minutes.  Gardening for 30 to 45 minutes.  Jumping rope for 15 minutes.  Washing windows or floors for 45 to 60 minutes. Document Released: 12/05/2010 Document Revised: 01/25/2012 Document Reviewed: 12/05/2010 [facility] Patient Information 2013 [place].   Other topics ( that may be useful information):    Sexually Transmitted Disease Sexually transmitted disease (STD) refers to any infection that is passed from person to person during sexual activity. This may happen by way of saliva, semen, blood, vaginal mucus, or urine. Common STDs include:  Gonorrhea.  Chlamydia.  Syphilis.  HIV/AIDS.  Genital herpes.  Hepatitis B and C.  Trichomonas.  Human papillomavirus (HPV).  Pubic lice. CAUSES  An STD may be spread by bacteria, virus, or parasite. A person can get an STD by:  Sexual intercourse with an infected person.  Sharing sex toys with an infected person.  Sharing needles with an infected person.  Having intimate contact with the genitals, mouth, or rectal areas of an infected person. SYMPTOMS  Some people may  they can still pass the infection to others. Different STDs have different symptoms. Symptoms include: °· Painful or bloody urination. °· Pain in the pelvis, abdomen, vagina, anus, throat, or eyes. °· Skin rash, itching, irritation, growths, or sores (lesions). These usually occur in the genital or anal area. °· Abnormal vaginal discharge. °· Penile discharge in men. °· Soft, flesh-colored skin growths in the  genital or anal area. °· Fever. °· Pain or bleeding during sexual intercourse. °· Swollen glands in the groin area. °· Yellow skin and eyes (jaundice). This is seen with hepatitis. °DIAGNOSIS  °To make a diagnosis, your caregiver may: °· Take a medical history. °· Perform a physical exam. °· Take a specimen (culture) to be examined. °· Examine a sample of discharge under a microscope. °· Perform blood test °TREATMENT  °· Chlamydia, gonorrhea, trichomonas, and syphilis can be cured with antibiotic medicine. °· Genital herpes, hepatitis, and HIV can be treated, but not cured, with prescribed medicines. The medicines will lessen the symptoms. °· Genital warts from HPV can be treated with medicine or by freezing, burning (electrocautery), or surgery. Warts may come back. °· HPV is a virus and cannot be cured with medicine or surgery. However, abnormal areas may be followed very closely by your caregiver and may be removed from the cervix, vagina, or vulva through office procedures or surgery. °If your diagnosis is confirmed, your recent sexual partners need treatment. This is true even if they are symptom-free or have a negative culture or evaluation. They should not have sex until their caregiver says it is okay. °HOME CARE INSTRUCTIONS °· All sexual partners should be informed, tested, and treated for all STDs. °· Take your antibiotics as directed. Finish them even if you start to feel better. °· Only take over-the-counter or prescription medicines for pain, discomfort, or fever as directed by your caregiver. °· Rest. °· Eat a balanced diet and drink enough fluids to keep your urine clear or pale yellow. °· Do not have sex until treatment is completed and you have followed up with your caregiver. STDs should be checked after treatment. °· Keep all follow-up appointments, Pap tests, and blood tests as directed by your caregiver. °· Only use latex condoms and water-soluble lubricants during sexual activity. Do not use  petroleum jelly or oils. °· Avoid alcohol and illegal drugs. °· Get vaccinated for HPV and hepatitis. If you have not received these vaccines in the past, talk to your caregiver about whether one or both might be right for you. °· Avoid risky sex practices that can break the skin. °The only way to avoid getting an STD is to avoid all sexual activity. Latex condoms and dental dams (for oral sex) will help lessen the risk of getting an STD, but will not completely eliminate the risk. °SEEK MEDICAL CARE IF:  °· You have a fever. °· You have any new or worsening symptoms. °Document Released: 01/23/2003 Document Revised: 01/25/2012 Document Reviewed: 01/30/2011 °ExitCare® Patient Information ©2013 [employer]. ° ° ° °Domestic Abuse °You are being battered or abused if someone close to you hits, pushes, or physically hurts you in any way. You also are being abused if you are forced into activities. You are being sexually abused if you are forced to have sexual contact of any kind. You are being emotionally abused if you are made to feel worthless or if you are constantly threatened. It is important to remember that help is available. No one has the right to abuse you. °PREVENTION OF FURTHER   abuse you. PREVENTION OF FURTHER ABUSE  Learn the warning signs of danger. This varies with situations but may include: the use of alcohol, threats, isolation from friends and family, or forced sexual contact. Leave if you feel that violence is going to occur.  If you are attacked or beaten, report it to the police so the abuse is documented. You do not have to press charges. The police can protect you while you or the attackers are leaving. Get the officer's name and badge number and a copy of the report.  Find someone you can trust and tell them what is happening to you: your caregiver, a nurse, clergy member, close friend or family member. Feeling ashamed is natural, but remember that you have done nothing wrong. No one deserves abuse. Document Released:  10/30/2000 Document Revised: 01/25/2012 Document Reviewed: 01/08/2011 [facility] Patient Information 2013 [place].    How Much is Too Much Alcohol? Drinking too much alcohol can cause injury, accidents, and health problems. These types of problems can include:   Car crashes.  Falls.  Family fighting (domestic violence).  Drowning.  Fights.  Injuries.  Burns.  Damage to certain organs.  Having a baby with birth defects. ONE DRINK CAN BE TOO MUCH WHEN YOU ARE:  Working.  Pregnant or breastfeeding.  Taking medicines. Ask your doctor.  Driving or planning to drive. If you or someone you know has a drinking problem, get help from a doctor.  Document Released: 08/29/2009 Document Revised: 01/25/2012 Document Reviewed: 08/29/2009 [facility] Patient Information 2013 [place].   Smoking Hazards Smoking cigarettes is extremely bad for your health. Tobacco smoke has over 200 known poisons in it. There are over 60 chemicals in tobacco smoke that cause cancer. Some of the chemicals found in cigarette smoke include:   Cyanide.  Benzene.  Formaldehyde.  Methanol (wood alcohol).  Acetylene (fuel used in welding torches).  Ammonia. Cigarette smoke also contains the poisonous gases nitrogen oxide and carbon monoxide.  Cigarette smokers have an increased risk of many serious medical problems and Smoking causes approximately:  90% of all lung cancer deaths in men.  80% of all lung cancer deaths in women.  90% of deaths from chronic obstructive lung disease. Compared with nonsmokers, smoking increases the risk of:  Coronary heart disease by 2 to 4 times.  Stroke by 2 to 4 times.  Men developing lung cancer by 23 times.  Women developing lung cancer by 13 times.  Dying from chronic obstructive lung diseases by 12 times.  . Smoking is the most preventable cause of death and disease in our society.  WHY IS SMOKING ADDICTIVE?  Nicotine is the chemical  agent in tobacco that is capable of causing addiction or dependence.  When you smoke and inhale, nicotine is absorbed rapidly into the bloodstream through your lungs. Nicotine absorbed through the lungs is capable of creating a powerful addiction. Both inhaled and non-inhaled nicotine may be addictive.  Addiction studies of cigarettes and spit tobacco show that addiction to nicotine occurs mainly during the teen years, when young people begin using tobacco products. WHAT ARE THE BENEFITS OF QUITTING?  There are many health benefits to quitting smoking.   Likelihood of developing cancer and heart disease decreases. Health improvements are seen almost immediately.  Blood pressure, pulse rate, and breathing patterns start returning to normal soon after quitting. QUITTING SMOKING   American Lung Association - 1-800-LUNGUSA  American Cancer Society - 1-800-ACS-2345 Document Released: 12/10/2004 Document Revised: 01/25/2012 Document Reviewed: 08/14/2009  ExitCare Patient Information 2013 [place].   Stress Management Stress is a state of physical or mental tension that often results from changes in your life or normal routine. Some common causes of stress are:  Death of a loved one.  Injuries or severe illnesses.  Getting fired or changing jobs.  Moving into a new home. Other causes may be:  Sexual problems.  Business or financial losses.  Taking on a large debt.  Regular conflict with someone at home or at work.  Constant tiredness from lack of sleep. It is not just bad things that are stressful. It may be stressful to:  Win the lottery.  Get married.  Buy a new car. The amount of stress that can be easily tolerated varies from person to person. Changes generally cause stress, regardless of the types of change. Too much stress can affect your health. It may lead to physical or emotional problems. Too little stress (boredom) may also become stressful. SUGGESTIONS TO  REDUCE STRESS:  Talk things over with your family and friends. It often is helpful to share your concerns and worries. If you feel your problem is serious, you may want to get help from a professional counselor.  Consider your problems one at a time instead of lumping them all together. Trying to take care of everything at once may seem impossible. List all the things you need to do and then start with the most important one. Set a goal to accomplish 2 or 3 things each day. If you expect to do too many in a single day you will naturally fail, causing you to feel even more stressed.  Do not use alcohol or drugs to relieve stress. Although you may feel better for a short time, they do not remove the problems that caused the stress. They can also be habit forming.  Exercise regularly - at least 3 times per week. Physical exercise can help to relieve that "uptight" feeling and will relax you.  The shortest distance between despair and hope is often a good night's sleep.  Go to bed and get up on time allowing yourself time for appointments without being rushed.  Take a short "time-out" period from any stressful situation that occurs during the day. Close your eyes and take some deep breaths. Starting with the muscles in your face, tense them, hold it for a few seconds, then relax. Repeat this with the muscles in your neck, shoulders, hand, stomach, back and legs.  Take good care of yourself. Eat a balanced diet and get plenty of rest.  Schedule time for having fun. Take a break from your daily routine to relax. HOME CARE INSTRUCTIONS   Call if you feel overwhelmed by your problems and feel you can no longer manage them on your own.  Return immediately if you feel like hurting yourself or someone else. Document Released: 04/28/2001 Document Revised: 01/25/2012 Document Reviewed: 12/19/2007 [facility] Patient Information 2013 [place].

## 2019-10-03 LAB — RUBELLA SCREEN: Rubella Antibodies, IGG: 2.14 index (ref >0.99)

## 2019-12-06 ENCOUNTER — Other Ambulatory Visit: Payer: Self-pay | Admitting: Family Medicine

## 2019-12-06 DIAGNOSIS — Z1231 Encounter for screening mammogram for malignant neoplasm of breast: Secondary | ICD-10-CM

## 2019-12-07 ENCOUNTER — Ambulatory Visit
Admission: RE | Admit: 2019-12-07 | Discharge: 2019-12-07 | Disposition: A | Discharge status: Home / Self Care | Payer: 59 | Source: Ambulatory Visit | Attending: Family Medicine | Admitting: Family Medicine

## 2019-12-07 ENCOUNTER — Other Ambulatory Visit: Payer: Self-pay

## 2019-12-07 DIAGNOSIS — Z1231 Encounter for screening mammogram for malignant neoplasm of breast: Secondary | ICD-10-CM

## 2020-01-25 ENCOUNTER — Ambulatory Visit: Payer: 59 | Attending: Internal Medicine

## 2020-01-25 DIAGNOSIS — Z23 Encounter for immunization: Secondary | ICD-10-CM

## 2020-01-25 NOTE — Progress Notes (Signed)
   Covid-19 Vaccination Clinic  Name:  Sandra Burton    MRN: [id number] DOB: 08-Jun-1991  01/25/2020  [name] was observed post Covid-19 immunization for 15 minutes without incident. She was provided with Vaccine Information Sheet and instruction to access the V-Safe system.   [name] was instructed to call 911 with any severe reactions post vaccine: Marland Kitchen Difficulty breathing  . Swelling of face and throat  . A fast heartbeat  . A bad rash all over body  . Dizziness and weakness   Immunizations Administered    Name Date Dose VIS Date Route   Pfizer COVID-19 Vaccine 01/25/2020 11:37 AM 0.3 mL 10/27/2019 Intramuscular   Manufacturer: [place]   Lot: [id number]   [place]: [id number]

## 2020-01-29 ENCOUNTER — Encounter: Payer: Self-pay | Admitting: Certified Nurse Midwife

## 2020-01-31 ENCOUNTER — Encounter: Payer: Self-pay | Admitting: Certified Nurse Midwife

## 2020-02-19 ENCOUNTER — Ambulatory Visit: Payer: 59 | Attending: Internal Medicine

## 2020-02-19 DIAGNOSIS — Z23 Encounter for immunization: Secondary | ICD-10-CM

## 2020-02-19 NOTE — Progress Notes (Signed)
   Covid-19 Vaccination Clinic  Name:  Sandra Burton    MRN: [id number] DOB: [birth year]  02/19/2020  [name] was observed post Covid-19 immunization for 15 minutes without incident. She was provided with Vaccine Information Sheet and instruction to access the V-Safe system.   [name] was instructed to call 911 with any severe reactions post vaccine: Marland Kitchen Difficulty breathing  . Swelling of face and throat  . A fast heartbeat  . A bad rash all over body  . Dizziness and weakness   Immunizations Administered    Name Date Dose VIS Date Route   Pfizer COVID-19 Vaccine 02/19/2020 12:02 PM 0.3 mL 10/27/2019 Intramuscular   Manufacturer: [place]   Lot: [id number]   [place]: [id number]

## 2020-10-01 NOTE — Progress Notes (Signed)
29 y.o. G0P0000 Married White or Caucasian Not Hispanic or Latino female here for annual exam.   Period Pattern: Regular Menstrual Flow: Moderate Menstrual Control: Tampon, UPT faint + today  Just started trying to get pregnant. No ectopic risk factors. Cycles are every 28 days.   Patient's last menstrual period was 09/10/2020 (exact date).          Sexually active: Yes.    The current method of family planning is condoms sometimes.    Exercising: Yes.    zumba & pilates Smoker:  no  Health Maintenance: Pap:  09-02-17 neg History of abnormal Pap:  no MMG:  12/07/19 density D Bi-rads 1 neg  BMD:   None  Colonoscopy: none  TDaP:  09/19/18  Gardasil: not done poct upt+  reports that she has never smoked. She has never used smokeless tobacco. She reports previous alcohol use. She reports that she does not use drugs.  Past Medical History:  Diagnosis Date   Asthma    Hodgkin lymphoma ([place])    chemo, radiation. completed 8/09. treated @ [facility] stage II   Personal history of radiation therapy 2009   Hodgkin's Lymphoma    Past Surgical History:  Procedure Laterality Date   DEEP NECK LYMPH NODE BIOPSY / EXCISION     KNEE SURGERY     acl repair X 3    Current Outpatient Medications  Medication Sig Dispense Refill   Prenatal Vit-Fe Fumarate-FA (PRENATAL VITAMIN PO) Take by mouth.     No current facility-administered medications for this visit.    Family History  Problem Relation Age of Onset   Thyroid disease Mother 22       thyroidectomy   Diabetes Father    Diabetes Paternal Aunt    Diabetes Paternal Grandmother     Review of Systems  Constitutional: Negative.   HENT: Negative.   Eyes: Negative.   Respiratory: Negative.   Cardiovascular: Negative.   Gastrointestinal: Negative.   Endocrine: Negative.   Genitourinary: Negative.   Musculoskeletal: Negative.   Skin: Negative.   Allergic/Immunologic: Negative.   Neurological: Negative.   Hematological:  Negative.   Psychiatric/Behavioral: Negative.     Exam:   BP 120/80    Pulse 72    Resp 16    Ht 5' 6.75" (1.695 m)    Wt 130 lb (59 kg)    LMP 09/10/2020 (Exact Date)    BMI 20.51 kg/m   Weight change: @WEIGHTCHANGE @ Height:   Height: 5' 6.75" (169.5 cm)  Ht Readings from Last 3 Encounters:  10/03/20 5' 6.75" (1.695 m)  10/02/19 5' 6.75" (1.695 m)  09/22/18 5' 7.5" (1.715 m)    General appearance: alert, cooperative and appears stated age  A:  Patient here for annual exam, +UPT. No ectopic risk factors  P:   Annual not done, she will have her exam with OB.  Discussed things to avoid in pregnancy (ie ETOH, unpasteurized food, medications, saunas/hot tubs)  Given list of OB's  Given inform on medications that are safe in pregnancy and common questions in pregnancy

## 2020-10-02 ENCOUNTER — Ambulatory Visit: Payer: 59 | Admitting: Certified Nurse Midwife

## 2020-10-03 ENCOUNTER — Ambulatory Visit (INDEPENDENT_AMBULATORY_CARE_PROVIDER_SITE_OTHER): Payer: 59 | Admitting: Obstetrics and Gynecology

## 2020-10-03 ENCOUNTER — Other Ambulatory Visit: Payer: Self-pay

## 2020-10-03 ENCOUNTER — Encounter: Payer: Self-pay | Admitting: Obstetrics and Gynecology

## 2020-10-03 VITALS — BP 120/80 | HR 72 | Resp 16 | Ht 66.75 in | Wt 130.0 lb

## 2020-10-03 DIAGNOSIS — N926 Irregular menstruation, unspecified: Secondary | ICD-10-CM | POA: Diagnosis not present

## 2020-10-03 DIAGNOSIS — Z3201 Encounter for pregnancy test, result positive: Secondary | ICD-10-CM | POA: Diagnosis not present

## 2020-10-03 LAB — POCT URINE PREGNANCY: Preg Test, Ur: POSITIVE — AB

## 2020-10-03 NOTE — Patient Instructions (Signed)
Commonly Asked Questions During Pregnancy  Cats: A parasite can be excreted in cat feces.  To avoid exposure you need to have another person empty the little box.  If you must empty the litter box you will need to wear gloves.  Wash your hands after handling your cat.  This parasite can also be found in raw or undercooked meat so this should also be avoided.  Colds, Sore Throats, Flu: Please check your medication sheet to see what you can take for symptoms.  If your symptoms are unrelieved by these medications please call the office.  Dental Work: Most any dental work your dentist recommends is permitted.  X-rays should only be taken during the first trimester if absolutely necessary.  Your abdomen should be shielded with a lead apron during all x-rays.  Please notify your provider prior to receiving any x-rays.  Novocaine is fine; gas is not recommended.  If your dentist requires a note from us prior to dental work please call the office and we will provide one for you.  Exercise: Exercise is an important part of staying healthy during your pregnancy.  You may continue most exercises you were accustomed to prior to pregnancy.  Later in your pregnancy you will most likely notice you have difficulty with activities requiring balance like riding a bicycle.  It is important that you listen to your body and avoid activities that put you at a higher risk of falling.  Adequate rest and staying well hydrated are a must!  If you have questions about the safety of specific activities ask your provider.    Exposure to Children with illness: Try to avoid obvious exposure; report any symptoms to us when noted,  If you have chicken pos, red measles or mumps, you should be immune to these diseases.   Please do not take any vaccines while pregnant unless you have checked with your OB provider.  Fetal Movement: After 28 weeks we recommend you do "kick counts" twice daily.  Lie or sit down in a calm quiet environment and  count your baby movements "kicks".  You should feel your baby at least 10 times per hour.  If you have not felt 10 kicks within the first hour get up, walk around and have something sweet to eat or drink then repeat for an additional hour.  If count remains less than 10 per hour notify your provider.  Fumigating: Follow your pest control agent's advice as to how long to stay out of your home.  Ventilate the area well before re-entering.  Hemorrhoids:   Most over-the-counter preparations can be used during pregnancy.  Check your medication to see what is safe to use.  It is important to use a stool softener or fiber in your diet and to drink lots of liquids.  If hemorrhoids seem to be getting worse please call the office.   Hot Tubs:  Hot tubs Jacuzzis and saunas are not recommended while pregnant.  These increase your internal body temperature and should be avoided.  Intercourse:  Sexual intercourse is safe during pregnancy as long as you are comfortable, unless otherwise advised by your provider.  Spotting may occur after intercourse; report any bright red bleeding that is heavier than spotting.  Labor:  If you know that you are in labor, please go to the hospital.  If you are unsure, please call the office and let us help you decide what to do.  Lifting, straining, etc:  If your job requires heavy   lifting or straining please check with your provider for any limitations.  Generally, you should not lift items heavier than that you can lift simply with your hands and arms (no back muscles)  Painting:  Paint fumes do not harm your pregnancy, but may make you ill and should be avoided if possible.  Latex or water based paints have less odor than oils.  Use adequate ventilation while painting.  Permanents & Hair Color:  Chemicals in hair dyes are not recommended as they cause increase hair dryness which can increase hair loss during pregnancy.  " Highlighting" and permanents are allowed.  Dye may be  absorbed differently and permanents may not hold as well during pregnancy.  Sunbathing:  Use a sunscreen, as skin burns easily during pregnancy.  Drink plenty of fluids; avoid over heating.  Tanning Beds:  Because their possible side effects are still unknown, tanning beds are not recommended.  Ultrasound Scans:  Routine ultrasounds are performed at approximately 20 weeks.  You will be able to see your baby's general anatomy an if you would like to know the gender this can usually be determined as well.  If it is questionable when you conceived you may also receive an ultrasound early in your pregnancy for dating purposes.  Otherwise ultrasound exams are not routinely performed unless there is a medical necessity.  Although you can request a scan we ask that you pay for it when conducted because insurance does not cover " patient request" scans.  Work: If your pregnancy proceeds without complications you may work until your due date, unless your physician or employer advises otherwise.  Round Ligament Pain/Pelvic Discomfort:  Sharp, shooting pains not associated with bleeding are fairly common, usually occurring in the second trimester of pregnancy.  They tend to be worse when standing up or when you remain standing for long periods of time.  These are the result of pressure of certain pelvic ligaments called "round ligaments".  Rest, Tylenol and heat seem to be the most effective relief.  As the womb and fetus grow, they rise out of the pelvis and the discomfort improves.  Please notify the office if your pain seems different than that described.  It may represent a more serious condition.  Common Medications Safe in Pregnancy  Acne:      Constipation:  Benzoyl Peroxide     Colace  Clindamycin      Dulcolax Suppository  Topica Erythromycin     Fibercon  Salicylic Acid      Metamucil         Miralax AVOID:        Senakot   Accutane    Cough:  Retin-A       Cough  Drops  Tetracycline      Phenergan w/ Codeine if Rx  Minocycline      Robitussin (Plain & DM)  Antibiotics:     Crabs/Lice:  Ceclor       RID  Cephalosporins    AVOID:  E-Mycins      Kwell  Keflex  Macrobid/Macrodantin   Diarrhea:  Penicillin      Kao-Pectate  Zithromax      Imodium AD         PUSH FLUIDS AVOID:       Cipro     Fever:  Tetracycline      Tylenol (Regular or Extra  Minocycline       Strength)  Levaquin      Extra Strength-Do not  Exceed 8 tabs/24 hrs Caffeine:        <200mg/day (equiv. To 1 cup of coffee or  approx. 3 12 oz sodas)         Gas: Cold/Hayfever:       Gas-X  Benadryl      Mylicon  Claritin       Phazyme  **Claritin-D        Chlor-Trimeton    Headaches:  Dimetapp      ASA-Free Excedrin  Drixoral-Non-Drowsy     Cold Compress  Mucinex (Guaifenasin)     Tylenol (Regular or Extra  Sudafed/Sudafed-12 Hour     Strength)  **Sudafed PE Pseudoephedrine   Tylenol Cold & Sinus     Vicks Vapor Rub  Zyrtec  **AVOID if Problems With Blood Pressure         Heartburn: Avoid lying down for at least 1 hour after meals  Aciphex      Maalox     Rash:  Milk of Magnesia     Benadryl    Mylanta       1% Hydrocortisone Cream  Pepcid  Pepcid Complete   Sleep Aids:  Prevacid      Ambien   Prilosec       Benadryl  Rolaids       Chamomile Tea  Tums (Limit 4/day)     Unisom         Tylenol PM         Warm milk-add vanilla or  Hemorrhoids:       Sugar for taste  Anusol/Anusol H.C.  (RX: Analapram 2.5%)  Sugar Substitutes:  Hydrocortisone OTC     Ok in moderation  Preparation H      Tucks        Vaseline lotion applied to tissue with wiping    Herpes:     Throat:  Acyclovir      Oragel  Famvir  Valtrex     Vaccines:         Flu Shot Leg Cramps:       *Gardasil  Benadryl      Hepatitis A         Hepatitis B Nasal Spray:       Pneumovax  Saline Nasal Spray     Polio Booster         Tetanus Nausea:       Tuberculosis test or PPD  Vitamin  B6 25 mg TID   AVOID:    Dramamine      *Gardasil  Emetrol       Live Poliovirus  Ginger Root 250 mg QID    MMR (measles, mumps &  High Complex Carbs @ Bedtime    rebella)  Sea Bands-Accupressure    Varicella (Chickenpox)  Unisom 1/2 tab TID     *No known complications           If received before Pain:         Known pregnancy;   Darvocet       Resume series after  Lortab        Delivery  Percocet    Yeast:   Tramadol      Femstat  Tylenol 3      Gyne-lotrimin  Ultram       Monistat  Vicodin           MISC:         All Sunscreens             Hair Coloring/highlights          Insect Repellant's          (Including DEET)         Mystic Tans  

## 2020-11-16 NOTE — L&D Delivery Note (Signed)
Delivery Note Pt pushed great about 45 minutes and at 1:44 PM a viable female was delivered via Vaginal, Spontaneous (Presentation: Left Occiput Anterior).  APGAR:7 , 8; weight pending 8#2oz.   Placenta status: Spontaneous, Intact.  Cord: 3 vessels with the following complications: Corporal cord in front of chest. Baby had good tone and spontaneous cry, but was not vigorously crying and had excess secretions so was brought to warmer and O2 sats noted to be in 80's so NICU called to assess. O2 sats responding to O2 so attempting to wean off at bedside.  Anesthesia: Epidural Episiotomy: None Lacerations: 2nd degree Suture Repair: 3.0 vicryl rapide Est. Blood Loss (mL): 195m  Mom to postpartum.  Baby to  be assessed by NICU for the lower O2 saturations and see if can transition off oxygen  .  KLogan Bores8/11/2020, 2:10 PM

## 2020-11-20 LAB — OB RESULTS CONSOLE HIV ANTIBODY (ROUTINE TESTING): HIV: NONREACTIVE

## 2020-11-20 LAB — OB RESULTS CONSOLE RUBELLA ANTIBODY, IGM: Rubella: IMMUNE

## 2020-11-20 LAB — OB RESULTS CONSOLE HEPATITIS B SURFACE ANTIGEN: Hepatitis B Surface Ag: NEGATIVE

## 2020-11-20 LAB — OB RESULTS CONSOLE GC/CHLAMYDIA
Chlamydia: NEGATIVE
Gonorrhea: NEGATIVE

## 2021-05-20 LAB — OB RESULTS CONSOLE GBS: GBS: NEGATIVE

## 2021-06-10 ENCOUNTER — Telehealth (HOSPITAL_COMMUNITY): Payer: Self-pay | Admitting: *Deleted

## 2021-06-10 ENCOUNTER — Encounter (HOSPITAL_COMMUNITY): Payer: Self-pay | Admitting: *Deleted

## 2021-06-10 NOTE — Telephone Encounter (Signed)
Preadmission screen  

## 2021-06-13 ENCOUNTER — Other Ambulatory Visit (HOSPITAL_COMMUNITY)
Admission: RE | Admit: 2021-06-13 | Discharge: 2021-06-13 | Disposition: A | Discharge status: Home / Self Care | Payer: 59 | Source: Ambulatory Visit | Attending: Obstetrics and Gynecology | Admitting: Obstetrics and Gynecology

## 2021-06-13 DIAGNOSIS — O26893 Other specified pregnancy related conditions, third trimester: Secondary | ICD-10-CM | POA: Diagnosis not present

## 2021-06-13 DIAGNOSIS — Z20822 Contact with and (suspected) exposure to covid-19: Secondary | ICD-10-CM | POA: Insufficient documentation

## 2021-06-13 DIAGNOSIS — Z0371 Encounter for suspected problem with amniotic cavity and membrane ruled out: Secondary | ICD-10-CM | POA: Diagnosis not present

## 2021-06-13 DIAGNOSIS — Z01812 Encounter for preprocedural laboratory examination: Secondary | ICD-10-CM | POA: Insufficient documentation

## 2021-06-13 DIAGNOSIS — Z3A39 39 weeks gestation of pregnancy: Secondary | ICD-10-CM | POA: Diagnosis not present

## 2021-06-13 LAB — SARS CORONAVIRUS 2 (TAT 6-24 HRS): SARS Coronavirus 2: NEGATIVE

## 2021-06-15 ENCOUNTER — Other Ambulatory Visit: Payer: Self-pay | Admitting: Obstetrics and Gynecology

## 2021-06-15 ENCOUNTER — Inpatient Hospital Stay (HOSPITAL_COMMUNITY): Admission: AD | Admit: 2021-06-15 | Payer: 59 | Source: Home / Self Care | Admitting: Obstetrics and Gynecology

## 2021-06-15 ENCOUNTER — Inpatient Hospital Stay (EMERGENCY_DEPARTMENT_HOSPITAL)
Admission: AD | Admit: 2021-06-15 | Discharge: 2021-06-15 | Disposition: A | Discharge status: Home / Self Care | Payer: 59 | Source: Home / Self Care | Attending: Obstetrics and Gynecology | Admitting: Obstetrics and Gynecology

## 2021-06-15 ENCOUNTER — Other Ambulatory Visit: Payer: Self-pay

## 2021-06-15 ENCOUNTER — Encounter (HOSPITAL_COMMUNITY): Payer: Self-pay | Admitting: Obstetrics and Gynecology

## 2021-06-15 DIAGNOSIS — Z3A39 39 weeks gestation of pregnancy: Secondary | ICD-10-CM

## 2021-06-15 DIAGNOSIS — Z0371 Encounter for suspected problem with amniotic cavity and membrane ruled out: Secondary | ICD-10-CM

## 2021-06-15 DIAGNOSIS — Z3689 Encounter for other specified antenatal screening: Secondary | ICD-10-CM

## 2021-06-15 LAB — WET PREP, GENITAL
Clue Cells Wet Prep HPF POC: NONE SEEN
Sperm: NONE SEEN
Trich, Wet Prep: NONE SEEN
Yeast Wet Prep HPF POC: NONE SEEN

## 2021-06-15 LAB — POCT FERN TEST: POCT Fern Test: NEGATIVE

## 2021-06-15 NOTE — MAU Note (Signed)
Pt states she woke up at 145 am and her pj shorts and bed were saturated with clear fluid. Pt states she has not had any additional leaking since. Denies bleeding or bloody show. Endorses + fetal movement. Pt is G1P0 at 39.5 weeks and states her pregnancy has been uncomplicated.

## 2021-06-15 NOTE — H&P (Signed)
Sandra Burton is a 30 y.o. female G1P0 at 35 6/7 weeks (EDD 06/17/21 by LMP c/w 10 week [place]) presenting for elective IOL. Prenatal care overall uncomplicated.  Pt has h/o Hodkin's Lymphoma 2009 s/p chemo and radiation, in remission for >10 years.    OB History     Gravida  1   Para  0   Term  0   Preterm  0   AB  0   Living  0      SAB  0   IAB  0   Ectopic  0   Multiple  0   Live Births             Past Medical History:  Diagnosis Date   Asthma    Hodgkin lymphoma ([place])    chemo, radiation. completed 8/09. treated @ [facility] stage II   Personal history of radiation therapy 2009   Hodgkin's Lymphoma   Past Surgical History:  Procedure Laterality Date   DEEP NECK LYMPH NODE BIOPSY / EXCISION     KNEE SURGERY     acl repair X 3   Family History: family history includes Diabetes in her father, paternal aunt, and paternal grandmother; Thyroid disease (age of onset: 24) in her mother. Social History:  reports that she has never smoked. She has never used smokeless tobacco. She reports previous alcohol use. She reports that she does not use drugs.     Maternal Diabetes: No Genetic Screening: Normal Maternal Ultrasounds/Referrals: Isolated choroid plexus cyst Fetal Ultrasounds or other Referrals:  None Maternal Substance Abuse:  No Significant Maternal Medications:  None Significant Maternal Lab Results:  Rh negative Other Comments:  None  Review of Systems  Constitutional:  Negative for fever.  Gastrointestinal:  Negative for abdominal pain.  Maternal Medical History:  Contractions: Frequency: irregular.   Perceived severity is mild.   Fetal activity: Perceived fetal activity is normal.   Prenatal complications: no prenatal complications Prenatal Complications - Diabetes: none.    Last menstrual period 09/10/2020. Maternal Exam:  Uterine Assessment: Contraction strength is mild.  Contraction frequency is irregular.  Abdomen: Patient reports no abdominal  tenderness. Fetal presentation: vertex Introitus: Normal vulva. Normal vagina.   Physical Exam Constitutional:      Appearance: Normal appearance.  Cardiovascular:     Rate and Rhythm: Normal rate and regular rhythm.  Pulmonary:     Effort: Pulmonary effort is normal.  Abdominal:     Palpations: Abdomen is soft.  Genitourinary:    General: Normal vulva.  Neurological:     Mental Status: She is alert.  Psychiatric:        Mood and Affect: Mood normal.    Prenatal labs: ABO, Rh:  O neg Antibody:  Neg Rubella:  Immune RPR:   NR HBsAg:  Neg  HIV:   NR GBS:   Neg One hour GCT 120 NIPT low risk Hgb AA  Assessment/Plan: Pt for IOL at term.  Plan ripening and then pitocin.  Epidural prn   Logan Bores 06/15/2021, 1:54 PM

## 2021-06-15 NOTE — MAU Provider Note (Signed)
S: Ms. Sandra Burton is a 30 y.o. G1P0000 at [redacted]w[redacted]d who presents to MAU today complaining of a gush of fluid  this morning while in bed. She denies recent sexual activity and vaginal bleeding. She denies contractions. She reports normal fetal movement.    O: BP 118/77 (BP Location: Right Arm)   Pulse 99   Temp 97.8 F (36.6 C) (Oral)   Resp 17   LMP 09/10/2020 (Exact Date)   SpO2 100%  GENERAL: Well-developed, well-nourished female in no acute distress.  HEAD: Normocephalic, atraumatic.  CHEST: Normal effort of breathing, regular heart rate ABDOMEN: Soft, nontender, gravid PELVIC: Normal external female genitalia. Vagina is pink and rugated. Cervix with normal contour, no lesions. Normal discharge.  Negative pooling. Fern and [employer]  Cervical exam:   Deferred   Fetal Monitoring: FHT: 135 bpm, Mod Var, -Decels, +Accels Toco: Irregular  Results for orders placed or performed during the hospital encounter of 06/15/21 (from the past 24 hour(s))  POCT fern test     Status: Normal   Collection Time: 06/15/21  3:45 AM  Result Value Ref Range   POCT Fern Test Negative = intact amniotic membranes   Wet prep, genital     Status: Abnormal   Collection Time: 06/15/21  4:06 AM   Specimen: Vaginal  Result Value Ref Range   Yeast Wet Prep HPF POC NONE SEEN NONE SEEN   Trich, Wet Prep NONE SEEN NONE SEEN   Clue Cells Wet Prep HPF POC NONE SEEN NONE SEEN   WBC, Wet Prep HPF POC FEW (A) NONE SEEN   Sperm NONE SEEN      A: SIUP at [id number] intact Cat I FT  P: Informed that exam not suggestive of SROM, but yeast infection suspected. Wet prep collected and sent. Will await results.  NST Reactive  EmGavin PoundCNM 06/15/2021 4:07 AM  Reassessment (4:41 AM) Wet prep returns negative. Provider to bedside to discuss results. Patient states she is scheduled for induction tonight. Labor precautions given. Discharged to home in stable condition.  JeMaryann ConnersMSN, CNM Advanced Practice Provider, [facility]

## 2021-06-15 NOTE — H&P (Deleted)
  The note originally documented on this encounter has been moved the the encounter in which it belongs.  

## 2021-06-16 ENCOUNTER — Inpatient Hospital Stay (HOSPITAL_COMMUNITY): Payer: 59 | Admitting: Anesthesiology

## 2021-06-16 ENCOUNTER — Inpatient Hospital Stay (HOSPITAL_COMMUNITY)
Admission: AD | Admit: 2021-06-16 | Discharge: 2021-06-18 | DRG: 807 | Disposition: A | Discharge status: Home / Self Care | Payer: 59 | Attending: Obstetrics and Gynecology | Admitting: Obstetrics and Gynecology

## 2021-06-16 ENCOUNTER — Inpatient Hospital Stay (HOSPITAL_COMMUNITY): Payer: 59

## 2021-06-16 ENCOUNTER — Other Ambulatory Visit: Payer: Self-pay

## 2021-06-16 ENCOUNTER — Encounter (HOSPITAL_COMMUNITY): Payer: Self-pay | Admitting: Obstetrics and Gynecology

## 2021-06-16 DIAGNOSIS — Z923 Personal history of irradiation: Secondary | ICD-10-CM | POA: Diagnosis not present

## 2021-06-16 DIAGNOSIS — O26893 Other specified pregnancy related conditions, third trimester: Principal | ICD-10-CM | POA: Diagnosis present

## 2021-06-16 DIAGNOSIS — Z8571 Personal history of Hodgkin lymphoma: Secondary | ICD-10-CM

## 2021-06-16 DIAGNOSIS — Z3A39 39 weeks gestation of pregnancy: Secondary | ICD-10-CM

## 2021-06-16 DIAGNOSIS — Z9221 Personal history of antineoplastic chemotherapy: Secondary | ICD-10-CM | POA: Diagnosis not present

## 2021-06-16 DIAGNOSIS — Z20822 Contact with and (suspected) exposure to covid-19: Secondary | ICD-10-CM | POA: Diagnosis present

## 2021-06-16 LAB — CBC
HCT: 33.2 % — ABNORMAL LOW (ref 36.0–46.0)
HCT: 33.9 % — ABNORMAL LOW (ref 36.0–46.0)
Hemoglobin: 10.9 g/dL — ABNORMAL LOW (ref 12.0–15.0)
Hemoglobin: 11.3 g/dL — ABNORMAL LOW (ref 12.0–15.0)
MCH: 32.2 pg (ref 26.0–34.0)
MCH: 32.5 pg (ref 26.0–34.0)
MCHC: 32.8 g/dL (ref 30.0–36.0)
MCHC: 33.3 g/dL (ref 30.0–36.0)
MCV: 97.4 fL (ref 80.0–100.0)
MCV: 97.9 fL (ref 80.0–100.0)
Platelets: 174 10*3/uL (ref 150–400)
Platelets: 190 10*3/uL (ref 150–400)
RBC: 3.39 MIL/uL — ABNORMAL LOW (ref 3.87–5.11)
RBC: 3.48 MIL/uL — ABNORMAL LOW (ref 3.87–5.11)
RDW: 13.6 % (ref 11.5–15.5)
RDW: 13.7 % (ref 11.5–15.5)
WBC: 11.4 10*3/uL — ABNORMAL HIGH (ref 4.0–10.5)
WBC: 12.4 10*3/uL — ABNORMAL HIGH (ref 4.0–10.5)
nRBC: 0 % (ref 0.0–0.2)
nRBC: 0 % (ref 0.0–0.2)

## 2021-06-16 LAB — RPR: RPR Ser Ql: NONREACTIVE

## 2021-06-16 MED ORDER — OXYCODONE-ACETAMINOPHEN 5-325 MG PO TABS: 1.0000 | ORAL_TABLET | ORAL | Status: DC | PRN

## 2021-06-16 MED ORDER — FENTANYL-BUPIVACAINE-NACL 0.5-0.125-0.9 MG/250ML-% EP SOLN
12.0000 mL/h | EPIDURAL | Status: DC | PRN
Start: 2021-06-16 — End: 2021-06-16
  Administered 2021-06-16: 12 mL/h via EPIDURAL
  Filled 2021-06-16: qty 250

## 2021-06-16 MED ORDER — IBUPROFEN 600 MG PO TABS
600.0000 mg | ORAL_TABLET | Freq: Four times a day (QID) | ORAL | Status: DC
  Administered 2021-06-16 – 2021-06-18 (×4): 600 mg via ORAL
  Filled 2021-06-16 (×5): qty 1

## 2021-06-16 MED ORDER — SENNOSIDES-DOCUSATE SODIUM 8.6-50 MG PO TABS
2.0000 | ORAL_TABLET | ORAL | Status: DC
  Administered 2021-06-17: 2 via ORAL
  Filled 2021-06-16: qty 2

## 2021-06-16 MED ORDER — ONDANSETRON HCL 4 MG/2ML IJ SOLN: 4.0000 mg | INTRAMUSCULAR | Status: DC | PRN

## 2021-06-16 MED ORDER — OXYTOCIN BOLUS FROM INFUSION
333.0000 mL | Freq: Once | INTRAVENOUS | Status: AC
  Administered 2021-06-16: 333 mL via INTRAVENOUS

## 2021-06-16 MED ORDER — LIDOCAINE HCL (PF) 1 % IJ SOLN: 30.0000 mL | INTRAMUSCULAR | Status: DC | PRN

## 2021-06-16 MED ORDER — PRENATAL MULTIVITAMIN CH
1.0000 | ORAL_TABLET | Freq: Every day | ORAL | Status: DC
  Administered 2021-06-17: 1 via ORAL
  Filled 2021-06-16: qty 1

## 2021-06-16 MED ORDER — BUTORPHANOL TARTRATE 1 MG/ML IJ SOLN
1.0000 mg | INTRAMUSCULAR | Status: DC | PRN
  Administered 2021-06-16: 1 mg via INTRAVENOUS
  Filled 2021-06-16: qty 1

## 2021-06-16 MED ORDER — LACTATED RINGERS IV SOLN: 500.0000 mL | INTRAVENOUS | Status: DC | PRN

## 2021-06-16 MED ORDER — ACETAMINOPHEN 325 MG PO TABS: 650.0000 mg | ORAL_TABLET | ORAL | Status: DC | PRN

## 2021-06-16 MED ORDER — EPHEDRINE 5 MG/ML INJ: 10.0000 mg | INTRAVENOUS | Status: DC | PRN

## 2021-06-16 MED ORDER — TERBUTALINE SULFATE 1 MG/ML IJ SOLN: 0.2500 mg | Freq: Once | INTRAMUSCULAR | Status: DC | PRN

## 2021-06-16 MED ORDER — SIMETHICONE 80 MG PO CHEW: 80.0000 mg | CHEWABLE_TABLET | ORAL | Status: DC | PRN

## 2021-06-16 MED ORDER — ZOLPIDEM TARTRATE 5 MG PO TABS: 5.0000 mg | ORAL_TABLET | Freq: Every evening | ORAL | Status: DC | PRN

## 2021-06-16 MED ORDER — DIPHENHYDRAMINE HCL 25 MG PO CAPS: 25.0000 mg | ORAL_CAPSULE | Freq: Four times a day (QID) | ORAL | Status: DC | PRN

## 2021-06-16 MED ORDER — DIBUCAINE (PERIANAL) 1 % EX OINT: 1.0000 "application " | TOPICAL_OINTMENT | CUTANEOUS | Status: DC | PRN

## 2021-06-16 MED ORDER — LACTATED RINGERS IV SOLN: INTRAVENOUS | Status: DC

## 2021-06-16 MED ORDER — OXYTOCIN-SODIUM CHLORIDE 30-0.9 UT/500ML-% IV SOLN
2.5000 [IU]/h | INTRAVENOUS | Status: DC
  Administered 2021-06-16: 2.5 [IU]/h via INTRAVENOUS

## 2021-06-16 MED ORDER — MISOPROSTOL 25 MCG QUARTER TABLET
25.0000 ug | ORAL_TABLET | ORAL | Status: AC | PRN
  Administered 2021-06-16 (×2): 25 ug via VAGINAL
  Filled 2021-06-16 (×2): qty 1

## 2021-06-16 MED ORDER — COCONUT OIL OIL: 1.0000 "application " | TOPICAL_OIL | Status: DC | PRN

## 2021-06-16 MED ORDER — DIPHENHYDRAMINE HCL 50 MG/ML IJ SOLN: 12.5000 mg | INTRAMUSCULAR | Status: DC | PRN

## 2021-06-16 MED ORDER — ONDANSETRON HCL 4 MG PO TABS: 4.0000 mg | ORAL_TABLET | ORAL | Status: DC | PRN

## 2021-06-16 MED ORDER — BENZOCAINE-MENTHOL 20-0.5 % EX AERO
1.0000 "application " | INHALATION_SPRAY | CUTANEOUS | Status: DC | PRN
  Administered 2021-06-16: 1 via TOPICAL
  Filled 2021-06-16: qty 56

## 2021-06-16 MED ORDER — OXYCODONE-ACETAMINOPHEN 5-325 MG PO TABS: 2.0000 | ORAL_TABLET | ORAL | Status: DC | PRN

## 2021-06-16 MED ORDER — TETANUS-DIPHTH-ACELL PERTUSSIS 5-2.5-18.5 LF-MCG/0.5 IM SUSY: 0.5000 mL | PREFILLED_SYRINGE | Freq: Once | INTRAMUSCULAR | Status: DC

## 2021-06-16 MED ORDER — SOD CITRATE-CITRIC ACID 500-334 MG/5ML PO SOLN: 30.0000 mL | ORAL | Status: DC | PRN

## 2021-06-16 MED ORDER — PHENYLEPHRINE 40 MCG/ML (10ML) SYRINGE FOR IV PUSH (FOR BLOOD PRESSURE SUPPORT): 80.0000 ug | PREFILLED_SYRINGE | INTRAVENOUS | Status: DC | PRN

## 2021-06-16 MED ORDER — LACTATED RINGERS IV SOLN: 500.0000 mL | Freq: Once | INTRAVENOUS | Status: DC

## 2021-06-16 MED ORDER — OXYTOCIN-SODIUM CHLORIDE 30-0.9 UT/500ML-% IV SOLN
1.0000 m[IU]/min | INTRAVENOUS | Status: DC
  Administered 2021-06-16: 2 m[IU]/min via INTRAVENOUS
  Filled 2021-06-16: qty 500

## 2021-06-16 MED ORDER — LIDOCAINE HCL (PF) 1 % IJ SOLN
INTRAMUSCULAR | Status: DC | PRN
  Administered 2021-06-16: 11 mL via EPIDURAL

## 2021-06-16 MED ORDER — ONDANSETRON HCL 4 MG/2ML IJ SOLN
4.0000 mg | Freq: Four times a day (QID) | INTRAMUSCULAR | Status: DC | PRN
  Administered 2021-06-16: 4 mg via INTRAVENOUS
  Filled 2021-06-16: qty 2

## 2021-06-16 MED ORDER — WITCH HAZEL-GLYCERIN EX PADS: 1.0000 "application " | MEDICATED_PAD | CUTANEOUS | Status: DC | PRN

## 2021-06-16 NOTE — Lactation Note (Signed)
This note was copied from a baby's chart. Lactation Consultation Note  Patient Name: Sandra Burton [id number] Date: 06/16/2021 Reason for consult: Initial assessment;Mother's request;Primapara;1st time breastfeeding;Term Age:31 hours  Mom working on latching in football. [place] set up pillows and assisted Mom getting a deep latch.  Mom denied any pain with the latch once she switched to a football hold.   Mom nipples are erect with no signs of abrasion.   Plan 1. To feed based on cues 8-12x in 24 hr period no more than 4 hrs without an attempt. Mom to offer both breasts and look for signs of milk transfer.  2. If unable to latch, Mom to offer EBM via spoon  3. I and O sheet reviewed.  4. [place] brochure of inpatient and outpatient services reviewed.  All questions latched at the end of the feeding.   Maternal Data Has patient been taught Hand Expression?: Yes Does the patient have breastfeeding experience prior to this delivery?: No  Feeding Mother's Current Feeding Choice: Breast Milk  LATCH Score Latch: Repeated attempts needed to sustain latch, nipple held in mouth throughout feeding, stimulation needed to elicit sucking reflex.  Audible Swallowing: A few with stimulation  Type of Nipple: Everted at rest and after stimulation  Comfort (Breast/Nipple): Soft / non-tender  Hold (Positioning): Assistance needed to correctly position infant at breast and maintain latch.  LATCH Score: 7   Lactation Tools Discussed/Used    Interventions Interventions: Breast feeding basics reviewed;Support pillows;Education;Assisted with latch;Position options;Skin to skin;Expressed milk;Breast massage;Hand express;Breast compression;Adjust position  Discharge Pump: Personal  Consult Status Consult Status: Follow-up Date: 06/17/21 Follow-up type: In-patient    Kelso Bibby  Nicholson-Springer 06/16/2021, 8:07 PM

## 2021-06-16 NOTE — Anesthesia Procedure Notes (Signed)
Epidural Patient location during procedure: OB Start time: 06/16/2021 7:58 AM End time: 06/16/2021 8:19 AM  Staffing Anesthesiologist: Lynda Rainwater, MD Performed: anesthesiologist   Preanesthetic Checklist Completed: patient identified, IV checked, site marked, risks and benefits discussed, surgical consent, monitors and equipment checked, pre-op evaluation and timeout performed  Epidural Patient position: sitting Prep: ChloraPrep Patient monitoring: heart rate, cardiac monitor, continuous pulse ox and blood pressure Approach: midline Location: L2-L3 Injection technique: LOR saline  Needle:  Needle type: Tuohy  Needle gauge: 17 G Needle length: 9 cm Needle insertion depth: 5 cm Catheter type: closed end flexible Catheter size: 20 Guage Catheter at skin depth: 9 cm Test dose: negative  Assessment Events: blood not aspirated, injection not painful, no injection resistance, no paresthesia and negative IV test  Additional Notes Reason for block:procedure for pain

## 2021-06-16 NOTE — Interval H&P Note (Signed)
History and Physical Interval Note:  06/16/2021 9:45 AM  Admitted overnight and received cytotec x 2.  FHR category [place]

## 2021-06-16 NOTE — Progress Notes (Signed)
Patient ID: Sandra Burton, female   DOB: 1991-08-16, 30 y.o.   MRN: [id number] Pt got very uncomfortable and just received her epidural, feels good now   FHR category 1  Cervix 80/3/-1 IUPC placed so can titrate pitocin as needed  Follow progress, good response to cytotec

## 2021-06-16 NOTE — Progress Notes (Signed)
Patient ID: Sandra Burton, female   DOB: [birth year], 30 y.o.   MRN: [id number] Pt feeling some left discomfort  Afeb VSS  FHR category 1 Cervix c/c/+2   To start pushing

## 2021-06-16 NOTE — H&P (View-Only) (Signed)
Patient ID: Sandra Burton, female   DOB: 06/20/1991, 30 y.o.   MRN: [id number] Pt got very uncomfortable and just received her epidural, feels good now   FHR category 1  Cervix 80/3/-1 IUPC placed so can titrate pitocin as needed  Follow progress, good response to cytotec

## 2021-06-16 NOTE — Anesthesia Preprocedure Evaluation (Signed)
Anesthesia Evaluation  Patient identified by MRN, date of birth, ID band Patient awake    Reviewed: Allergy & Precautions, NPO status , Patient's Chart, lab work & pertinent test results  Airway Mallampati: II  TM Distance: >3 FB Neck ROM: Full    Dental no notable dental hx.    Pulmonary asthma ,    Pulmonary exam normal breath sounds clear to auscultation       Cardiovascular negative cardio ROS Normal cardiovascular exam Rhythm:Regular Rate:Normal     Neuro/Psych negative neurological ROS  negative psych ROS   GI/Hepatic negative GI ROS, Neg liver ROS,   Endo/Other  negative endocrine ROS  Renal/GU negative Renal ROS  negative genitourinary   Musculoskeletal negative musculoskeletal ROS (+)   Abdominal   Peds negative pediatric ROS (+)  Hematology negative hematology ROS (+)   Anesthesia Other Findings   Reproductive/Obstetrics (+) Pregnancy                             Anesthesia Physical Anesthesia Plan  ASA: 2  Anesthesia Plan: Epidural   Post-op Pain Management:    Induction:   PONV Risk Score and Plan:   Airway Management Planned:   Additional Equipment:   Intra-op Plan:   Post-operative Plan:   Informed Consent:   Plan Discussed with:   Anesthesia Plan Comments:         Anesthesia Quick Evaluation

## 2021-06-17 ENCOUNTER — Inpatient Hospital Stay (HOSPITAL_COMMUNITY): Admit: 2021-06-17 | Payer: Self-pay

## 2021-06-17 LAB — CBC
HCT: 30.6 % — ABNORMAL LOW (ref 36.0–46.0)
Hemoglobin: 10 g/dL — ABNORMAL LOW (ref 12.0–15.0)
MCH: 32.1 pg (ref 26.0–34.0)
MCHC: 32.7 g/dL (ref 30.0–36.0)
MCV: 98.1 fL (ref 80.0–100.0)
Platelets: 168 10*3/uL (ref 150–400)
RBC: 3.12 MIL/uL — ABNORMAL LOW (ref 3.87–5.11)
RDW: 13.6 % (ref 11.5–15.5)
WBC: 13.2 10*3/uL — ABNORMAL HIGH (ref 4.0–10.5)
nRBC: 0 % (ref 0.0–0.2)

## 2021-06-17 LAB — TYPE AND SCREEN
ABO/RH(D): O NEG
Antibody Screen: NEGATIVE

## 2021-06-17 MED ORDER — RHO D IMMUNE GLOBULIN 1500 UNIT/2ML IJ SOSY
300.0000 ug | PREFILLED_SYRINGE | Freq: Once | INTRAMUSCULAR | Status: AC
  Administered 2021-06-17: 300 ug via INTRAVENOUS
  Filled 2021-06-17: qty 2

## 2021-06-17 NOTE — Progress Notes (Signed)
Post Partum Day 1 Subjective: no complaints, up ad lib, voiding, tolerating PO, + flatus, and pain well controlled. Baby cluster fed last night so only got 2hrs of sleep but otherwise no complaints. Bonding well.  Mild pain at site of vaginal sutures as expected. Dermoplast helping  Objective: Blood pressure 97/66, pulse 74, temperature 98.2 F (36.8 C), temperature source Oral, resp. rate 16, height '5\' 7"'$  (1.702 m), weight 76.9 kg, last menstrual period 09/10/2020, SpO2 99 %, unknown if currently breastfeeding.  Physical Exam:  General: alert, cooperative, and no distress Lochia: appropriate Uterine Fundus: firm Incision: n/a DVT Evaluation: No evidence of DVT seen on physical exam.  Recent Labs    06/16/21 0630 06/17/21 0536  HGB 10.9* 10.0*  HCT 33.2* 30.6*    Assessment/Plan: Plan for discharge tomorrow   LOS: 1 day   [place] 06/17/2021, 10:09 AM

## 2021-06-17 NOTE — Anesthesia Postprocedure Evaluation (Signed)
Anesthesia Post Note  Patient: Engineer, technical sales  Procedure(s) Performed: AN AD HOC LABOR EPIDURAL     Patient location during evaluation: Mother Baby Anesthesia Type: Epidural Level of consciousness: awake and alert Pain management: pain level controlled Vital Signs Assessment: post-procedure vital signs reviewed and stable Respiratory status: spontaneous breathing, nonlabored ventilation and respiratory function stable Cardiovascular status: stable Postop Assessment: no headache, no backache and epidural receding Anesthetic complications: no   No notable events documented.  Last Vitals:  Vitals:   06/17/21 0005 06/17/21 0250  BP: 98/67 97/66  Pulse: 70 74  Resp: 16 16  Temp: 36.8 C 36.8 C  SpO2: 98% 99%    Last Pain:  Vitals:   06/17/21 0844  TempSrc:   PainSc: 0-No pain   Pain Goal: Patients Stated Pain Goal: 2 (06/16/21 1812)              Epidural/Spinal Function Cutaneous sensation: Normal sensation (06/17/21 0844), Patient able to flex knees: Yes (06/17/21 0844), Patient able to lift hips off bed: Yes (06/17/21 0844), Back pain beyond tenderness at insertion site: No (06/17/21 0844), Progressively worsening motor and/or sensory loss: No (06/17/21 0844), Bowel and/or bladder incontinence post epidural: No (06/17/21 0844)  [place] [employer]

## 2021-06-18 LAB — RH IG WORKUP (INCLUDES ABO/RH)
Fetal Screen: NEGATIVE
Gestational Age(Wks): 39.6
Unit division: 0

## 2021-06-18 MED ORDER — IBUPROFEN 800 MG PO TABS: 800.0000 mg | ORAL_TABLET | Freq: Three times a day (TID) | ORAL | 1 refills | Status: DC | PRN

## 2021-06-18 NOTE — Progress Notes (Signed)
POSTPARTUM PROGRESS NOTE  Post Partum Day #2  Subjective:  No acute events overnight.  Pt denies problems with ambulating, voiding or po intake.  She denies nausea or vomiting.  Pain is well controlled Lochia Minimal.   Objective: Blood pressure 103/65, pulse 73, temperature 98.3 F (36.8 C), temperature source Oral, resp. rate 18, height '5\' 7"'$  (1.702 m), weight 76.9 kg, last menstrual period 09/10/2020, SpO2 98 %, unknown if currently breastfeeding.  Physical Exam:  General: alert, cooperative and no distress Lochia:normal flow Chest: CTAB Heart: RRR no m/r/g Abdomen: +BS, soft, nontender Uterine Fundus: firm, 2cm below umbilicus GU: suture intact, healing well, no purulent drainage Extremities: neg edema, neg calf TTP BL, neg Homans BL  Recent Labs    06/16/21 0630 06/17/21 0536  HGB 10.9* 10.0*  HCT 33.2* 30.6*    Assessment/Plan:  ASSESSMENT: Sandra Burton is a 30 y.o. G1P1001 s/p SVD @ [redacted]w[redacted]d PNC c/b Hodgkin's lymphoma >168yrago s/p chemo and radiation and in remission.   Discharge home and Breastfeeding   LOS: 2 days

## 2021-06-18 NOTE — Lactation Note (Signed)
This note was copied from a baby's chart. Lactation Consultation Note  Patient Name: Sandra Burton Today's Date: 06/18/2021   Age:31 hours  Mom says feedings have gotten much better. Mom says her nipples are a little tender. They are mostly atraumatic in appearance, but there is a fading compression stripe on part of her R nipple. Her L nipple also has faded portions of a compression stripe. Mom said that she had seen pinched nipples after feeding when learning how to breastfeed the first night, but she has not seen pinched nipples in some time. Mom reported that with the last couple of feedings, her nipples have been rounded when infant released her latch.   Mom feels that infant is swallowing when at breast.   Mom declines having me to return to observe a latch. Parents know how to reach [place] for post-discharge questions.    Matthias Hughs [facility] 06/18/2021, 10:11 AM

## 2021-06-18 NOTE — Discharge Summary (Signed)
Postpartum Discharge Summary  Date of Service updated     Patient Name: Sandra Burton DOB: October 25, 1991 MRN: [id number]  Date of admission: 06/16/2021 Delivery date:06/16/2021  Delivering provider: Paula Compton  Date of discharge: 06/18/2021  Admitting diagnosis: Indication for care in labor and delivery, antepartum [O75.9] NSVD (normal spontaneous vaginal delivery) [O80] Intrauterine pregnancy: [redacted]w[redacted]d    Secondary diagnosis:  Active Problems:   Indication for care in labor and delivery, antepartum   NSVD (normal spontaneous vaginal delivery)  Additional problems: History of Hodgkin's lymphoma in remission    Discharge diagnosis: Term Pregnancy Delivered                                              Post partum procedures: none Augmentation: AROM, Pitocin, and Cytotec Complications: None  Hospital course: Induction of Labor With Vaginal Delivery   30y.o. yo G1P1001 at 37w6das admitted to the hospital 06/16/2021 for induction of labor.  Indication for induction: Favorable cervix at term.  Patient had an uncomplicated labor course as follows: Membrane Rupture Time/Date: 3:37 AM ,06/16/2021   Delivery Method:Vaginal, Spontaneous  Episiotomy: None  Lacerations:  2nd degree  Details of delivery can be found in separate delivery note.  Patient had a routine postpartum course. Patient is discharged home 06/18/21.  Newborn Data: Birth date:06/16/2021  Birth time:1:44 PM  Gender:Female  Living status:Living  Apgars:7 ,8  Weight:3711 g   Physical exam  Vitals:   06/17/21 0250 06/17/21 1410 06/17/21 2230 06/18/21 0612  BP: 97/66 108/73 107/65 103/65  Pulse: 74 85 85 73  Resp: '16 18 18 18  '$ Temp: 98.2 F (36.8 C) 97.9 F (36.6 C) 97.8 F (36.6 C) 98.3 F (36.8 C)  TempSrc: Oral Oral Oral Oral  SpO2: 99% 99% 100% 98%  Weight:      Height:       General: alert, cooperative, and no distress Lochia: appropriate Uterine Fundus: firm Incision: N/A DVT Evaluation: No evidence  of DVT seen on physical exam. Negative Homan's sign. No cords or calf tenderness. Labs: Lab Results  Component Value Date   WBC 13.2 (H) 06/17/2021   HGB 10.0 (L) 06/17/2021   HCT 30.6 (L) 06/17/2021   MCV 98.1 06/17/2021   PLT 168 06/17/2021   No flowsheet data found. Edinburgh Score: Edinburgh Postnatal Depression Scale Screening Tool 06/18/2021  I have been able to laugh and see the funny side of things. 0  I have looked forward with enjoyment to things. 0  I have blamed myself unnecessarily when things went wrong. 0  I have been anxious or worried for no good reason. 0  I have felt scared or panicky for no good reason. 1  Things have been getting on top of me. 0  I have been so unhappy that I have had difficulty sleeping. 0  I have felt sad or miserable. 0  I have been so unhappy that I have been crying. 0  The thought of harming myself has occurred to me. 0  Edinburgh Postnatal Depression Scale Total 1      After visit meds:  Allergies as of 06/18/2021       Reactions   Sulfamethoxazole    History of sulfa allergy as a child        Medication List     STOP taking these medications  ferrous sulfate 325 (65 FE) MG tablet       TAKE these medications    ibuprofen 800 MG tablet Commonly known as: ADVIL Take 1 tablet (800 mg total) by mouth every 8 (eight) hours as needed.   PRENATAL VITAMIN PO Take by mouth.         Discharge home in stable condition Infant Feeding: Breast Infant Disposition:home with mother Discharge instruction: per After Visit Summary and Postpartum booklet. Activity: Advance as tolerated. Pelvic rest for 6 weeks.  Diet: routine diet Anticipated Birth Control: Unsure Postpartum Appointment:6 weeks       06/18/2021 Deliah Boston, MD

## 2021-06-30 ENCOUNTER — Telehealth (HOSPITAL_COMMUNITY): Payer: Self-pay

## 2021-06-30 NOTE — Telephone Encounter (Signed)
"  I'm doing pretty good. I think everything is going pretty good." Patient has no questions or concerns about her healing.  "She's good. I had some trouble in the beginning with breastfeeding. I got some help from the lactation consultant at the pediatrician office and that was very helpful. Feedings are going good now. She sleeps in her bassinet next to my bed."RN reviewed ABC's of safe sleep with patient. Patient declines any questions or concerns about baby.  EPDS score is 2.  Sandra Burton [facility] 06/30/2021,1457

## 2021-10-02 NOTE — Progress Notes (Deleted)
30 y.o. G71P1001 Married White or Caucasian Not Hispanic or Latino female here for annual exam.      No LMP recorded.          Sexually active: {yes no:314532}  The current method of family planning is {contraception:315051}.    Exercising: {yes no:314532}  {types:19826} Smoker:  {YES [id number]  Health Maintenance: Pap:  09/02/17 WNL , 06/21/14 WNL  History of abnormal Pap:  no MMG:  12/07/19 Density D Bi-rads 1 neg  BMD:   none  Colonoscopy: none  TDaP:  03/27/21 Gardasil: ***   reports that she has never smoked. She has never used smokeless tobacco. She reports that she does not currently use alcohol. She reports that she does not use drugs.  Past Medical History:  Diagnosis Date   Asthma    Hodgkin lymphoma ([place])    chemo, radiation. completed 8/09. treated @ [facility] stage II   Personal history of radiation therapy 2009   Hodgkin's Lymphoma    Past Surgical History:  Procedure Laterality Date   DEEP NECK LYMPH NODE BIOPSY / EXCISION     KNEE SURGERY     acl repair X 3    Current Outpatient Medications  Medication Sig Dispense Refill   ibuprofen (ADVIL) 800 MG tablet Take 1 tablet (800 mg total) by mouth every 8 (eight) hours as needed. 60 tablet 1   Prenatal Vit-Fe Fumarate-FA (PRENATAL VITAMIN PO) Take by mouth.     No current facility-administered medications for this visit.    Family History  Problem Relation Age of Onset   Thyroid disease Mother 55       thyroidectomy   Diabetes Father    Diabetes Paternal Aunt    Diabetes Paternal Grandmother     Review of Systems  Exam:   There were no vitals taken for this visit.  Weight change: @WEIGHTCHANGE @ Height:      Ht Readings from Last 3 Encounters:  06/16/21 5\' 7"  (1.702 m)  10/03/20 5' 6.75" (1.695 m)  10/02/19 5' 6.75" (1.695 m)    General appearance: alert, cooperative and appears stated age Head: Normocephalic, without obvious abnormality, atraumatic Neck: no adenopathy, supple, symmetrical, trachea  midline and thyroid {CHL AMB PHY EX THYROID NORM DEFAULT:[phone]::"normal to inspection and palpation"} Lungs: clear to auscultation bilaterally Cardiovascular: regular rate and rhythm Breasts: {Exam; breast:13139::"normal appearance, no masses or tenderness"} Abdomen: soft, non-tender; non distended,  no masses,  no organomegaly Extremities: extremities normal, atraumatic, no cyanosis or edema Skin: Skin color, texture, turgor normal. No rashes or lesions Lymph nodes: Cervical, supraclavicular, and axillary nodes normal. No abnormal inguinal nodes palpated Neurologic: Grossly normal   Pelvic: External genitalia:  no lesions              Urethra:  normal appearing urethra with no masses, tenderness or lesions              Bartholins and Skenes: normal                 Vagina: normal appearing vagina with normal color and discharge, no lesions              Cervix: {CHL AMB PHY EX CERVIX NORM DEFAULT:[phone]::"no lesions"}               Bimanual Exam:  Uterus:  {CHL AMB PHY EX UTERUS NORM DEFAULT:[phone]::"normal size, contour, position, consistency, mobility, non-tender"}              Adnexa: {CHL AMB PHY EX  ADNEXA NO MASS DEFAULT:[phone]::"no mass, fullness, tenderness"}               Rectovaginal: Confirms               Anus:  normal sphincter tone, no lesions  *** chaperoned for the exam.  A:  Well Woman with normal exam  P:

## 2021-10-13 ENCOUNTER — Ambulatory Visit: Payer: 59 | Admitting: Obstetrics and Gynecology

## 2022-01-27 ENCOUNTER — Other Ambulatory Visit: Payer: Self-pay | Admitting: Family Medicine

## 2022-01-27 DIAGNOSIS — Z8571 Personal history of Hodgkin lymphoma: Secondary | ICD-10-CM

## 2022-01-27 DIAGNOSIS — Z923 Personal history of irradiation: Secondary | ICD-10-CM

## 2022-03-02 ENCOUNTER — Other Ambulatory Visit: Payer: Self-pay | Admitting: Family Medicine

## 2022-03-02 DIAGNOSIS — Z923 Personal history of irradiation: Secondary | ICD-10-CM

## 2022-03-02 DIAGNOSIS — Z8571 Personal history of Hodgkin lymphoma: Secondary | ICD-10-CM

## 2022-03-04 ENCOUNTER — Ambulatory Visit
Admission: RE | Admit: 2022-03-04 | Discharge: 2022-03-04 | Disposition: A | Discharge status: Home / Self Care | Payer: 59 | Source: Ambulatory Visit | Attending: Family Medicine | Admitting: Family Medicine

## 2022-03-04 DIAGNOSIS — Z923 Personal history of irradiation: Secondary | ICD-10-CM

## 2022-03-04 DIAGNOSIS — Z8571 Personal history of Hodgkin lymphoma: Secondary | ICD-10-CM

## 2022-03-24 ENCOUNTER — Other Ambulatory Visit: Payer: 59

## 2022-11-27 ENCOUNTER — Ambulatory Visit
Admission: RE | Admit: 2022-11-27 | Discharge: 2022-11-27 | Disposition: A | Discharge status: Home / Self Care | Payer: 59 | Source: Ambulatory Visit | Attending: Family Medicine | Admitting: Family Medicine

## 2022-11-27 DIAGNOSIS — Z923 Personal history of irradiation: Secondary | ICD-10-CM

## 2022-11-27 DIAGNOSIS — Z8571 Personal history of Hodgkin lymphoma: Secondary | ICD-10-CM

## 2023-12-13 ENCOUNTER — Other Ambulatory Visit: Payer: Self-pay | Admitting: Family Medicine

## 2023-12-13 DIAGNOSIS — Z1231 Encounter for screening mammogram for malignant neoplasm of breast: Secondary | ICD-10-CM

## 2023-12-23 ENCOUNTER — Ambulatory Visit: Payer: 59

## 2024-01-04 ENCOUNTER — Ambulatory Visit
Admission: RE | Admit: 2024-01-04 | Discharge: 2024-01-04 | Disposition: A | Discharge status: Home / Self Care | Payer: 59 | Source: Ambulatory Visit | Attending: Family Medicine

## 2024-01-04 DIAGNOSIS — Z1231 Encounter for screening mammogram for malignant neoplasm of breast: Secondary | ICD-10-CM

## 2024-09-19 ENCOUNTER — Other Ambulatory Visit: Payer: Self-pay | Admitting: Family Medicine

## 2024-09-19 DIAGNOSIS — N6320 Unspecified lump in the left breast, unspecified quadrant: Secondary | ICD-10-CM

## 2024-09-22 ENCOUNTER — Other Ambulatory Visit: Payer: Self-pay | Admitting: Medical Genetics

## 2024-09-28 ENCOUNTER — Other Ambulatory Visit: Payer: Self-pay | Admitting: Family Medicine

## 2024-09-28 ENCOUNTER — Ambulatory Visit
Admission: RE | Admit: 2024-09-28 | Discharge: 2024-09-28 | Disposition: A | Source: Ambulatory Visit | Attending: Family Medicine

## 2024-09-28 ENCOUNTER — Ambulatory Visit
Admission: RE | Admit: 2024-09-28 | Discharge: 2024-09-28 | Disposition: A | Discharge status: Home / Self Care | Source: Ambulatory Visit | Attending: Family Medicine | Admitting: Family Medicine

## 2024-09-28 DIAGNOSIS — N6320 Unspecified lump in the left breast, unspecified quadrant: Secondary | ICD-10-CM

## 2024-10-02 ENCOUNTER — Ambulatory Visit
Admission: RE | Admit: 2024-10-02 | Discharge: 2024-10-02 | Disposition: A | Discharge status: Home / Self Care | Source: Ambulatory Visit | Attending: Family Medicine | Admitting: Family Medicine

## 2024-10-02 DIAGNOSIS — N6320 Unspecified lump in the left breast, unspecified quadrant: Secondary | ICD-10-CM

## 2024-10-02 HISTORY — PX: BREAST BIOPSY: SHX20

## 2024-10-03 LAB — SURGICAL PATHOLOGY

## 2024-10-09 ENCOUNTER — Encounter: Payer: Self-pay | Admitting: *Deleted

## 2024-10-09 DIAGNOSIS — Z171 Estrogen receptor negative status [ER-]: Secondary | ICD-10-CM | POA: Insufficient documentation

## 2024-10-09 NOTE — Progress Notes (Signed)
 Radiation Oncology         [phone] ________________________________  Name: Sandra Burton        MRN: [id number]  Date of Service: 10/11/2024 DOB: 06/09/1991  RR:[id number], Lamarr RAMAN, MD  Vanderbilt Ned, MD     REFERRING PHYSICIAN: Vanderbilt Ned, MD   DIAGNOSIS: The encounter diagnosis was Malignant neoplasm of upper-outer quadrant of left breast in female, estrogen receptor negative (HCC). C50.412, Z17.1   HISTORY OF PRESENT ILLNESS: Sandra Burton is a 33 y.o. female seen in consultation for radiation therapy.  She has a hx of Hodgkin's Lymphoma s/p therapeutic radiation back in 2009 who is now presenting with a palpable mass in her outer L breast. She had already begun early screening mammograms in setting of prior thoracic RT with most recent screening mammogram having been completed on 01/04/24. No mammographic evidence of malignancy was noted at tat time. In setting of palpable mass she underwent unilateral diagnostic mammogram and US  of the L breast. This revealed an indeterminate 1.7 cm mass in the UOQ of the L breast at posterior depth, initially thought to be a fibroadenoma but given her high-risk status a biopsy was recommended. 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  Estrogen exposure history: one child delivered at age 3, age 74 at first menstrual period, still having periods   Childbearing/breastfeeding: one child delivered at age 33, not currently pregnant, not trying to become pregnant, currently on Junel FE OCP daily   Family history of cancer: Personal hx of Hodgkin's Lymphoma, mother had thyroid cancer and maternal great grandmother had pancreatic cancer  PREVIOUS RADIATION THERAPY: Yes in 2009 for Hodgkin's Lymphoma, completed at [facility]. 21 Gy IFRT to L neck and mediastinum  AUTOIMMUNE DISEASE: No  MEDICAL DEVICES: No  PREGNANCY: No   PAST MEDICAL  HISTORY:  Past Medical History:  Diagnosis Date   Asthma    Family history of pancreatic cancer    Family history of prostate cancer    Family history of thyroid cancer    Hodgkin lymphoma (HCC)    chemo, radiation. completed 8/09. treated @ [facility] stage II   Mitral valve prolapse    Personal history of radiation therapy 2009   Hodgkin's Lymphoma       PAST SURGICAL HISTORY: Past Surgical History:  Procedure Laterality Date   BREAST BIOPSY Left 10/02/2024   US  LT BREAST BX W LOC DEV 1ST LESION IMG BX SPEC US  GUIDE 10/02/2024 [facility] MAMMOGRAPHY   DEEP NECK LYMPH NODE BIOPSY / EXCISION     KNEE SURGERY     acl repair X 3     FAMILY HISTORY:  Family History  Problem Relation Age of Onset   Thyroid disease Mother 71       thyroidectomy   Diabetes Father    Diabetes Paternal Grandmother    Diabetes Paternal Aunt    BRCA 1/2 Neg Hx    Breast cancer Neg Hx      SOCIAL HISTORY:  reports that she has never smoked. She has never used smokeless tobacco. She reports current alcohol use. She reports that she does not use drugs.   ALLERGIES: Sulfamethoxazole   MEDICATIONS:  No current outpatient medications on file.   No current facility-administered medications for this encounter.     REVIEW OF SYSTEMS:   Review of Systems  Constitutional: Negative.   HENT: Negative.    Eyes: Negative.   Respiratory: Negative.  Cardiovascular: Negative.   Gastrointestinal: Negative.   Genitourinary: Negative.   Musculoskeletal: Negative.   Skin: Negative.   Neurological: Negative.   Endo/Heme/Allergies: Negative.   Psychiatric/Behavioral: Negative.    All other systems reviewed and are negative.   PHYSICAL EXAM:  Wt Readings from Last 3 Encounters:  10/11/24 136 lb 1.6 oz (61.7 kg)  06/16/21 169 lb 8 oz (76.9 kg)  10/03/20 130 lb (59 kg)   Temp Readings from Last 3 Encounters:  10/11/24 98.2 F (36.8 C) (Temporal)  06/18/21 98.3 F (36.8 C) (Oral)  06/15/21 97.8  F (36.6 C) (Oral)   BP Readings from Last 3 Encounters:  10/11/24 116/75  06/18/21 103/65  06/15/21 118/77   Pulse Readings from Last 3 Encounters:  10/11/24 94  06/18/21 73  06/15/21 99    /10   Physical Exam Vitals and nursing note reviewed.  Constitutional:      General: She is not in acute distress. HENT:     Head: Normocephalic.  Eyes:     Extraocular Movements: Extraocular movements intact.  Cardiovascular:     Rate and Rhythm: Normal rate.  Pulmonary:     Effort: Pulmonary effort is normal. No respiratory distress.  Abdominal:     General: There is no distension.  Musculoskeletal:        General: No swelling.     Cervical back: Normal range of motion.  Skin:    General: Skin is warm and dry.     Coloration: Skin is not pale.     Findings: No erythema.  Neurological:     General: No focal deficit present.     Mental Status: She is alert.  Psychiatric:        Mood and Affect: Mood normal.      Breast Exam:  Palpable mass in UOQ of the L breast, approximately 2 cm, no other masses palpated.  ECOG = 0   LABORATORY DATA:  Lab Results  Component Value Date   WBC 7.0 10/11/2024   HGB 12.8 10/11/2024   HCT 38.5 10/11/2024   MCV 93.9 10/11/2024   PLT 223 10/11/2024   Lab Results  Component Value Date   NA 137 10/11/2024   K 4.2 10/11/2024   CL 104 10/11/2024   CO2 26 10/11/2024   Lab Results  Component Value Date   ALT 12 10/11/2024   AST 19 10/11/2024   ALKPHOS 45 10/11/2024   BILITOT 0.4 10/11/2024      RADIOGRAPHY: US  LT BREAST BX W LOC DEV 1ST LESION IMG BX SPEC US  GUIDE Addendum Date: 10/03/2024 ADDENDUM REPORT: 10/03/2024 14:04 ADDENDUM: PATHOLOGY revealed: Breast, left, needle core biopsy, 2 o'clock, 8 cmfn, coil- INVASIVE DUCTAL CARCINOMA, SEE NOTE DUCTAL CARCINOMA IN SITU, SOLID, HIGH NUCLEAR GRADE, WITH NECROSIS OVERALL GRADE: 3. LYMPHOVASCULAR INVASION: NOT IDENTIFIED, CANCER LENGTH: 0.6 CM, CALCIFICATIONS: PRESENT. Pathology  results are CONCORDANT with imaging findings, per Dr. Craig Farr. Pathology results and recommendations were discussed with patient via telephone on 10/03/2024 by Rock Hover RN. Patient reported biopsy site doing well with no adverse symptoms, and slight tenderness at the site. Post biopsy care instructions were reviewed, questions were answered and my direct phone number was provided. Patient was instructed to call [facility] for any additional questions or concerns related to biopsy site. RECOMMENDATION: 1. Surgical and oncological consultation. The patient was referred to [facility] with appointment on 10/11/24. 2. Consider pretreatment bilateral breast  MRI with and without contrast given patient's relatively young age and to evaluate extent of breast disease. Pathology results reported by Rock Hover RN on 10/03/2024. Electronically Signed   By: Craig Farr M.D.   On: 10/03/2024 14:04   Result Date: 10/03/2024 CLINICAL DATA:  Left breast mass for biopsy. EXAM: ULTRASOUND GUIDED LEFT BREAST CORE NEEDLE BIOPSY COMPARISON:  Previous exam(s). PROCEDURE: I met with the patient and we discussed the procedure of ultrasound-guided biopsy, including benefits and alternatives. We discussed the high likelihood of a successful procedure. We discussed the risks of the procedure, including infection, bleeding, tissue injury, clip migration, and inadequate sampling. Informed written consent was given. The usual time-out protocol was performed immediately prior to the procedure. Lesion quadrant: Upper-outer quadrant Using sterile technique and 1% Lidocaine  as local anesthetic, under direct ultrasound visualization, a 12 gauge spring-loaded device was used to perform biopsy of mass at left breast 2 o'clock using a lateral approach. At the conclusion of the procedure coil shaped tissue marker clip was deployed into the  biopsy cavity. Follow up 2 view mammogram was performed and dictated separately. IMPRESSION: Ultrasound guided biopsy of left breast.  No apparent complications. Electronically Signed: By: Craig Farr M.D. On: 10/02/2024 09:17   MM CLIP PLACEMENT LEFT Result Date: 10/02/2024 CLINICAL DATA:  Status post ultrasound-guided core biopsy of left breast mass. EXAM: 3D DIAGNOSTIC LEFT MAMMOGRAM POST ULTRASOUND BIOPSY COMPARISON:  Previous exam(s). ACR Breast Density Category d: The breasts are extremely dense, which lowers the sensitivity of mammography. FINDINGS: 3D Mammographic images were obtained following ultrasound guided biopsy of mass at left breast 2 o'clock. The coil biopsy marking clip is in expected position at the site of biopsy. IMPRESSION: Appropriate positioning of the coil shaped biopsy marking clip at the site of biopsy in the expected location of concern. Final Assessment: Post Procedure Mammograms for Marker Placement Electronically Signed   By: Craig Farr M.D.   On: 10/02/2024 09:18   MM 3D DIAGNOSTIC MAMMOGRAM UNILATERAL LEFT BREAST Result Date: 09/28/2024 CLINICAL DATA:  33 year old presenting with a palpable lump in the outer LEFT breast. Personal history of chest radiation in 2009 due to Hodgkin's lymphoma, placing the patient in the high risk category. EXAM: DIGITAL DIAGNOSTIC UNILATERAL LEFT MAMMOGRAM WITH TOMOSYNTHESIS AND CAD; ULTRASOUND LEFT BREAST LIMITED TECHNIQUE: Left digital diagnostic mammography and breast tomosynthesis was performed. The images were evaluated with computer-aided detection. ; Targeted ultrasound examination of the left breast was performed. COMPARISON:  Previous exam(s). ACR Breast Density Category d: The breasts are extremely dense, which lowers the sensitivity of mammography. FINDINGS: Full field CC and MLO views and a spot tangential view of the palpable concern were obtained. No findings suspicious for malignancy. No mammographic abnormality in the outer  breast in the area of palpable concern; dense breast tissue extending close to the skin surface is present in this location. Targeted ultrasound is performed in the area of palpable concern, demonstrating an oval parallel heterogeneous though predominantly hypoechoic mass with predominantly circumscribed margins, though there is shadowing along the lateral and medial edges of the mass which obscures their margins. The mass measures approximately 1.7 x 1.0 x 1.5 cm, demonstrating posterior acoustic enhancement and demonstrating internal power Doppler flow. The masses within dense fibroglandular tissue, accounting for the fact that it was inconspicuous on mammography Sonographic evaluation of the axilla demonstrates no pathologic lymphadenopathy. On correlative physical examination, there is a mobile palpable approximate 1-2 cm mass in the UPPER OUTER QUADRANT corresponding to what the patient  is feeling and corresponding to the sonographic finding. IMPRESSION: Indeterminate palpable 1.7 cm mass in the UPPER OUTER QUADRANT of the LEFT breast at posterior depth. While this likely represents a fibroadenoma, given the patient's high-risk status due to prior chest irradiation, ultrasound-guided core needle biopsy is recommended. RECOMMENDATION: Ultrasound-guided core needle biopsy of the LEFT breast mass. I have discussed the findings and recommendations with the patient. The ultrasound core needle biopsy procedure was discussed with the patient and her questions were answered she wishes to proceed with the biopsy which has been scheduled by the [facility] staff. BI-RADS CATEGORY  4: Suspicious. Electronically Signed   By: Debby Satterfield M.D.   On: 09/28/2024 15:53   US  LIMITED ULTRASOUND INCLUDING AXILLA LEFT BREAST  Result Date: 09/28/2024 CLINICAL DATA:  33 year old presenting with a palpable lump in the outer LEFT breast. Personal history of chest radiation in 2009 due to Hodgkin's  lymphoma, placing the patient in the high risk category. EXAM: DIGITAL DIAGNOSTIC UNILATERAL LEFT MAMMOGRAM WITH TOMOSYNTHESIS AND CAD; ULTRASOUND LEFT BREAST LIMITED TECHNIQUE: Left digital diagnostic mammography and breast tomosynthesis was performed. The images were evaluated with computer-aided detection. ; Targeted ultrasound examination of the left breast was performed. COMPARISON:  Previous exam(s). ACR Breast Density Category d: The breasts are extremely dense, which lowers the sensitivity of mammography. FINDINGS: Full field CC and MLO views and a spot tangential view of the palpable concern were obtained. No findings suspicious for malignancy. No mammographic abnormality in the outer breast in the area of palpable concern; dense breast tissue extending close to the skin surface is present in this location. Targeted ultrasound is performed in the area of palpable concern, demonstrating an oval parallel heterogeneous though predominantly hypoechoic mass with predominantly circumscribed margins, though there is shadowing along the lateral and medial edges of the mass which obscures their margins. The mass measures approximately 1.7 x 1.0 x 1.5 cm, demonstrating posterior acoustic enhancement and demonstrating internal power Doppler flow. The masses within dense fibroglandular tissue, accounting for the fact that it was inconspicuous on mammography Sonographic evaluation of the axilla demonstrates no pathologic lymphadenopathy. On correlative physical examination, there is a mobile palpable approximate 1-2 cm mass in the UPPER OUTER QUADRANT corresponding to what the patient is feeling and corresponding to the sonographic finding. IMPRESSION: Indeterminate palpable 1.7 cm mass in the UPPER OUTER QUADRANT of the LEFT breast at posterior depth. While this likely represents a fibroadenoma, given the patient's high-risk status due to prior chest irradiation, ultrasound-guided core needle biopsy is recommended.  RECOMMENDATION: Ultrasound-guided core needle biopsy of the LEFT breast mass. I have discussed the findings and recommendations with the patient. The ultrasound core needle biopsy procedure was discussed with the patient and her questions were answered she wishes to proceed with the biopsy which has been scheduled by the [facility] staff. BI-RADS CATEGORY  4: Suspicious. Electronically Signed   By: Debby Satterfield M.D.   On: 09/28/2024 15:53     PATHOLOGY:  L Breast Biopsy 10/02/24:  INITIAL DIAGNOSIS   1. Breast, left, needle core biopsy, 2 o'clock, 8cmfn, coil :       INVASIVE DUCTAL CARCINOMA, SEE NOTE       DUCTAL CARCINOMA IN SITU, SOLID, HIGH NUCLEAR GRADE, WITH NECROSIS       TUBULE FORMATION: SCORE 3       NUCLEAR PLEOMORPHISM: SCORE 3       MITOTIC COUNT: SCORE 3  TOTAL SCORE: 9       OVERALL GRADE: 9       LYMPHOVASCULAR INVASION: NOT IDENTIFIED       CANCER LENGTH: 0.6 CM       CALCIFICATIONS: PRESENT       OTHER FINDINGS: NONE     Results:  IMMUNOHISTOCHEMICAL AND MORPHOMETRIC ANALYSIS PERFORMED MANUALLY  The tumor cells are NEGATIVE for Her2 (0).  Estrogen Receptor:  0%, NEGATIVE  Progesterone Receptor:  0%, NEGATIVE  Proliferation Marker Ki67:  70%  COMMENT:  The negative hormone receptor study(ies) in this case has no internal positive control.    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 She is being seen pre-operatively for consideration of adjuvant radiation therapy. We discussed the general role of breast irradiation in reducing the risk of local recurrence and improving long-term disease control.   We reviewed that her case is complicated by the fact that she has previously had L chest radiation. Reirradiation can be done but there is a heightened risk of side effects to  nearby organs including the heart and lung.  We generally reviewed the logistics of treatment in detail, including the need for a CT simulation for treatment planning, followed by several days for contouring, dosimetry, and quality assurance prior to starting therapy. Radiation is typically completed Monday through Friday, over approximately 3-4 weeks. In setting of potential reirradiation, alternative treatment techniques and treatment regimens may be explored.  We discussed acute and subacute side effects which are generally gradual in onset and typically include fatigue and dermatitis  Less common but possible side effects include desquamation of the skin, decreased range of motion of the shoulder, and transient changes in breast size and texture.  Long-term risks such as cosmetic changes, rare risk of rib fracture and cardiopulmonary effects were also reviewed.   Patient verbalized understanding of these risks and benefits.  Given no nodes appear to be involved, if she is treated with mastectomy, no radiation would be necessary. If pathology reveals lymph nodes, or if she opts for breast conservation, radiation can be considered.   Final treatment planning will depend on decisions regarding neoadjuvant systemic therapy, surgical choice and the postoperative pathology report.   --  Total time spent today in preparation for this visit was 70 minutes. This included patient care, imaging and path review, documentation, multidisciplinary discussion and coordination of care and follow up.    Estefana HERO. Maritza, M.D.

## 2024-10-11 ENCOUNTER — Inpatient Hospital Stay: Attending: Hematology and Oncology | Admitting: Hematology and Oncology

## 2024-10-11 ENCOUNTER — Inpatient Hospital Stay

## 2024-10-11 ENCOUNTER — Ambulatory Visit: Attending: Surgery | Admitting: Physical Therapy

## 2024-10-11 ENCOUNTER — Encounter: Payer: Self-pay | Admitting: *Deleted

## 2024-10-11 ENCOUNTER — Telehealth: Payer: Self-pay | Admitting: Radiation Oncology

## 2024-10-11 ENCOUNTER — Other Ambulatory Visit: Payer: Self-pay

## 2024-10-11 ENCOUNTER — Ambulatory Visit
Admission: RE | Admit: 2024-10-11 | Discharge: 2024-10-11 | Disposition: A | Discharge status: Home / Self Care | Source: Ambulatory Visit | Attending: Radiation Oncology | Admitting: Radiation Oncology

## 2024-10-11 ENCOUNTER — Encounter: Payer: Self-pay | Admitting: Physical Therapy

## 2024-10-11 VITALS — BP 116/75 | HR 94 | Temp 98.2°F | Resp 16 | Ht 67.72 in | Wt 136.1 lb

## 2024-10-11 DIAGNOSIS — Z808 Family history of malignant neoplasm of other organs or systems: Secondary | ICD-10-CM | POA: Diagnosis not present

## 2024-10-11 DIAGNOSIS — Z1722 Progesterone receptor negative status: Secondary | ICD-10-CM | POA: Insufficient documentation

## 2024-10-11 DIAGNOSIS — J45909 Unspecified asthma, uncomplicated: Secondary | ICD-10-CM | POA: Insufficient documentation

## 2024-10-11 DIAGNOSIS — Z1379 Encounter for other screening for genetic and chromosomal anomalies: Secondary | ICD-10-CM | POA: Diagnosis not present

## 2024-10-11 DIAGNOSIS — Z800 Family history of malignant neoplasm of digestive organs: Secondary | ICD-10-CM | POA: Diagnosis not present

## 2024-10-11 DIAGNOSIS — C8190 Hodgkin lymphoma, unspecified, unspecified site: Secondary | ICD-10-CM

## 2024-10-11 DIAGNOSIS — Z8572 Personal history of non-Hodgkin lymphomas: Secondary | ICD-10-CM | POA: Insufficient documentation

## 2024-10-11 DIAGNOSIS — C50412 Malignant neoplasm of upper-outer quadrant of left female breast: Secondary | ICD-10-CM

## 2024-10-11 DIAGNOSIS — Z9221 Personal history of antineoplastic chemotherapy: Secondary | ICD-10-CM | POA: Diagnosis not present

## 2024-10-11 DIAGNOSIS — Z923 Personal history of irradiation: Secondary | ICD-10-CM | POA: Insufficient documentation

## 2024-10-11 DIAGNOSIS — Z8042 Family history of malignant neoplasm of prostate: Secondary | ICD-10-CM | POA: Insufficient documentation

## 2024-10-11 DIAGNOSIS — Z1732 Human epidermal growth factor receptor 2 negative status: Secondary | ICD-10-CM | POA: Diagnosis not present

## 2024-10-11 DIAGNOSIS — Z171 Estrogen receptor negative status [ER-]: Secondary | ICD-10-CM

## 2024-10-11 DIAGNOSIS — Z8571 Personal history of Hodgkin lymphoma: Secondary | ICD-10-CM | POA: Insufficient documentation

## 2024-10-11 LAB — CMP (CANCER CENTER ONLY)
ALT: 12 U/L (ref 0–44)
AST: 19 U/L (ref 15–41)
Albumin: 4.5 g/dL (ref 3.5–5.0)
Alkaline Phosphatase: 45 U/L (ref 38–126)
Anion gap: 8 (ref 5–15)
BUN: 8 mg/dL (ref 6–20)
CO2: 26 mmol/L (ref 22–32)
Calcium: 9.7 mg/dL (ref 8.9–10.3)
Chloride: 104 mmol/L (ref 98–111)
Creatinine: 0.74 mg/dL (ref 0.44–1.00)
GFR, Estimated: >60 mL/min (ref >60)
Glucose, Bld: 94 mg/dL (ref 70–99)
Potassium: 4.2 mmol/L (ref 3.5–5.1)
Sodium: 137 mmol/L (ref 135–145)
Total Bilirubin: 0.4 mg/dL (ref 0.0–1.2)
Total Protein: 7.3 g/dL (ref 6.5–8.1)

## 2024-10-11 LAB — CBC WITH DIFFERENTIAL (CANCER CENTER ONLY)
Abs Immature Granulocytes: 0.02 K/uL (ref 0.00–0.07)
Basophils Absolute: 0.1 K/uL (ref 0.0–0.1)
Basophils Relative: 1 %
Eosinophils Absolute: 0.2 K/uL (ref 0.0–0.5)
Eosinophils Relative: 2 %
HCT: 38.5 % (ref 36.0–46.0)
Hemoglobin: 12.8 g/dL (ref 12.0–15.0)
Immature Granulocytes: 0 %
Lymphocytes Relative: 23 %
Lymphs Abs: 1.6 K/uL (ref 0.7–4.0)
MCH: 31.2 pg (ref 26.0–34.0)
MCHC: 33.2 g/dL (ref 30.0–36.0)
MCV: 93.9 fL (ref 80.0–100.0)
Monocytes Absolute: 0.4 K/uL (ref 0.1–1.0)
Monocytes Relative: 5 %
Neutro Abs: 4.8 K/uL (ref 1.7–7.7)
Neutrophils Relative %: 69 %
Platelet Count: 223 K/uL (ref 150–400)
RBC: 4.1 MIL/uL (ref 3.87–5.11)
RDW: 11.8 % (ref 11.5–15.5)
WBC Count: 7 K/uL (ref 4.0–10.5)
nRBC: 0 % (ref 0.0–0.2)

## 2024-10-11 LAB — GENETIC SCREENING ORDER

## 2024-10-11 NOTE — Progress Notes (Signed)
 REFERRING PROVIDER: Vanderbilt Ned, MD [address] [place],  [place] [address]  PRIMARY PROVIDER:  Chrystal Lamarr RAMAN, MD  PRIMARY REASON FOR VISIT:  1. Malignant neoplasm of upper-outer quadrant of left breast in female, estrogen receptor negative (HCC)   2. Hodgkin lymphoma, unspecified Hodgkin lymphoma type, unspecified body region (HCC)   3. Family history of prostate cancer   4. Family history of thyroid cancer   5. Family history of pancreatic cancer    HISTORY OF PRESENT ILLNESS:   Sandra Burton, a 33 y.o. female, was seen on 10/11/2024 for a [place] cancer genetics consultation in the [facility] at the request of Dr. Vanderbilt due to a personal history of breast cancer.  Sandra Burton presents to clinic today to discuss the possibility of a hereditary predisposition to cancer, genetic testing, and to further clarify her future cancer risks, as well as potential cancer risks for family members.   CANCER HISTORY:  Oncology History  Malignant neoplasm of upper-outer quadrant of left breast in female, estrogen receptor negative (HCC)  10/09/2024 Initial Diagnosis   Malignant neoplasm of upper-outer quadrant of left breast in female, estrogen receptor negative (HCC)   10/11/2024 Cancer Staging   Staging form: Breast, AJCC 8th Edition - Clinical stage from 10/11/2024: Stage IB (cT1c, cN0, cM0, G3, ER-, PR-, HER2-) - Signed by Loretha Ash, MD on 10/11/2024 Stage prefix: Initial diagnosis Histologic grading system: 3 grade system Laterality: Left Staged by: Pathologist and managing physician Stage used in treatment planning: Yes National guidelines used in treatment planning: Yes Type of national guideline used in treatment planning: NCCN    In 2025, at the age of 40, Sandra Burton was diagnosed with invasive ductal carcinoma of the left breast, triple negative. The treatment plan includes neoadjuvant chemotherapy, surgical resection (surgery TBD), and  potential radiation.   She was also diagnosed with Hodgkin's Lymphoma at the age of 53. She received chemotherapy and 21 Gy radiation to L neck and mediastinum. She did not have a stem cell transplant as part of treatment and she does not have active disease.   RELEVANT MEDICAL HISTORY AND RISK FACTORS:  Menarche was at age 32.  First live birth at age 13.  OCP use for approximately: on/off since 2010 Ovaries intact: yes.  Hysterectomy: no.  Menopausal status: premenopausal. Mammogram within the last year: yes. Breast density: D Up to date with pelvic exams: yes. Colonoscopy: no; not examined. Any excessive radiation exposure in the past: yes, due to Hodgkin's Lymphoma  Past Medical History:  Diagnosis Date   Asthma    Family history of pancreatic cancer    Family history of prostate cancer    Family history of thyroid cancer    Hodgkin lymphoma (HCC)    chemo, radiation. completed 8/09. treated @ [facility] stage II   Mitral valve prolapse    Personal history of radiation therapy 2009   Hodgkin's Lymphoma    Past Surgical History:  Procedure Laterality Date   BREAST BIOPSY Left 10/02/2024   US  LT BREAST BX W LOC DEV 1ST LESION IMG BX SPEC US  GUIDE 10/02/2024 [facility] MAMMOGRAPHY   DEEP NECK LYMPH NODE BIOPSY / EXCISION     KNEE SURGERY     acl repair X 3    Social History   Socioeconomic History   Marital status: Married    Spouse name: Not on file   Number of children: Not on file   Years of education: Not on file  Highest education level: Not on file  Occupational History   Not on file  Tobacco Use   Smoking status: Never   Smokeless tobacco: Never  Vaping Use   Vaping status: Never Used  Substance and Sexual Activity   Alcohol use: Yes   Drug use: Never   Sexual activity: Yes    Partners: Male    Birth control/protection: Condom  Other Topics Concern   Not on file  Social History Narrative   Not on file   Social Drivers of Health   Financial  Resource Strain: Not on file  Food Insecurity: No Food Insecurity (10/07/2024)   Hunger Vital Sign    Worried About Running Out of Food in the Last Year: Never true    Ran Out of Food in the Last Year: Never true  Transportation Needs: No Transportation Needs (10/07/2024)   PRAPARE - Administrator, Civil Service (Medical): No    Lack of Transportation (Non-Medical): No  Physical Activity: Not on file  Stress: Not on file  Social Connections: Not on file     FAMILY HISTORY:  We obtained a detailed, 4-generation family history.  Significant diagnoses are listed below: Family History  Problem Relation Age of Onset   Thyroid disease Mother 63       thyroidectomy   Diabetes Father    Diabetes Paternal Grandmother    Diabetes Paternal Aunt    BRCA 1/2 Neg Hx    Breast cancer Neg Hx     Sandra Burton reports the following family history: Her mother was diagnosed with thyroid cancer when she was 27. She had a thyroidectomy and radioactive iodine for treatment of this cancer. Her maternal aunt had cancer in her 63s but Sandra Burton is unsure what type of cancer this was.  Her maternal grandfather had prostate cancer in his 53s. Her maternal great grandmother had pancreatic cancer. She does not report any paternal history of cancer.   Sandra Burton is unaware of previous family history of genetic testing for hereditary cancer risks. There is no reported Ashkenazi Jewish ancestry. There is no known consanguinity.  GENETIC COUNSELING ASSESSMENT:  Sandra Burton is a 33 y.o. female with a personal history of breast cancer diagnosed under the age of 14 and family history of pancreatic cancer which is somewhat suggestive of a hereditary cancer syndrome and predisposition to cancer given this history. We, therefore, discussed and recommended the following at today's visit.   DISCUSSION: We discussed that, in general, most cancer is not inherited in families, but instead is sporadic or familial.  Sporadic cancers occur by chance and typically happen at older ages (>50 years) as this type of cancer is caused by genetic changes acquired during an individual's lifetime. Some families have more cancers than would be expected by chance; however, the ages or types of cancer are not consistent with a known genetic mutation or known genetic mutations have been ruled out. This type of familial cancer is thought to be due to a combination of multiple genetic, environmental, hormonal, and lifestyle factors. While this combination of factors likely increases the risk of cancer, the exact source of this risk is not currently identifiable or testable.  We discussed that 5 - 10% of breast cancer is hereditary. Most cases of hereditary breast cancer are associated with BRCA1 and BRCA2 genes, although there are other genes associated with hereditary breast cancer as well. Cancer risks and management strategies are gene specific. We discussed that genetic testing can  beneficial for several reasons, including clarifying specific cancer risks, identifying potential screening and risk-reduction options that may be appropriate, and to understand if other family members could be at risk for cancer and allow them to undergo genetic testing to clarify their cancer risks.   We reviewed the characteristics, features and inheritance patterns of hereditary cancer syndromes. We also discussed genetic testing, including the appropriate family members to test, the process of testing, insurance coverage and turn-around-time for results. We discussed the implications of a negative, positive, carrier and/or variant of uncertain significant result.   Sandra Burton was offered the [facility]lus gene panel which includes sequencing and rearrangement analysis for the following 13 genes: ATM, BARD1, BRCA1, BRCA2, CDH1, CHEK2, NF1, PALB2, PTEN, RAD51C, RAD51D, STK11 and TP53 (sequencing and deletion/duplication).   Sandra Burton was offered the  [facility] CancerNext + RNAinsight gene panel which includes sequencing, rearrangement analysis, and RNA analysis for the following 40 genes: APC, ATM, BAP1, BARD1, BMPR1A, BRCA1, BRCA2, BRIP1, CDH1, CDKN2A, CHEK2, FH, FLCN, MET, MLH1, MSH2, MSH6, MUTYH, NF1, NTHL1, PALB2, PMS2, PTEN, RAD51C, RAD51D, RPS20, SMAD4, STK11, TP53, TSC1, TSC2, and VHL (sequencing and deletion/duplication); AXIN2, HOXB13, MBD4, MSH3, POLD1 and POLE (sequencing only); EPCAM and GREM1 (deletion/duplication only).   Sandra Burton was also offered the [facility] CancerNext-Expanded + RNAinsight gene panel which includes sequencing, rearrangement, and RNA analysis for the following 77 genes: AIP, ALK, APC, ATM, AXIN2, BAP1, BARD1, BMPR1A, BRCA1, BRCA2, BRIP1, CDC73, CDH1, CDK4, CDKN1B, CDKN2A, CEBPA, CHEK2, CTNNA1, DDX41, DICER1, ETV6, FH, FLCN, GATA2, LZTR1, MAX, MBD4, MEN1, MET, MLH1, MSH2, MSH3, MSH6, MUTYH, NF1, NF2, NTHL1, PALB2, PHOX2B, PMS2, POT1, PRKAR1A, PTCH1, PTEN, RAD51C, RAD51D, RB1, RET, RPS20, RUNX1, SDHA, SDHAF2, SDHB, SDHC, SDHD, SMAD4, SMARCA4, SMARCB1, SMARCE1, STK11, SUFU, TMEM127, TP53, TSC1, TSC2, VHL, and WT1 (sequencing and deletion/duplication); EGFR, HOXB13, KIT, MITF, PDGFRA, POLD1, and POLE (sequencing only); EPCAM and GREM1 (deletion/duplication only).    Sandra Burton was informed of the benefits and limitations of each panel, including that expanded pan-cancer panels contain genes may not have clear management guidelines at this point in time. We also discussed that as the number of genes included on a panel increases, the chances of variants of uncertain significance increases. After considering the risks, benefits, and limitations, Sandra Burton provided informed consent to pursue genetic testing. In order to get genetic test results in a timely manner so that Sandra Burton can use these genetic test results for surgical decisions, we recommended Sandra Burton pursue genetic testing for the [facility] BRCAPlus 13 gene panel, which is a stat  panel. This test will run concurrently with the [facility] CancerNext- Expanded + RNA gene panel.   Based on Sandra Burton's personal history of cancer, she meets medical criteria for genetic testing. she meets criteria due to being diagnosed with breast cancer under the age of 27. Despite that she meets criteria, she may still have an out of pocket cost. We discussed that if her out of pocket cost for testing is over $100, the laboratory will call and confirm whether she wants to proceed with testing.  If the out of pocket cost of testing is less than $100 she will be billed by the genetic testing laboratory.   We discussed that some people do not want to undergo genetic testing due to fear of genetic discrimination.  The Genetic Information Nondiscrimination Act (GINA) was signed into federal law in 2008. GINA prohibits health insurers and most employers from discriminating against individuals based on genetic information (including the results  of genetic tests and family history information). According to GINA, health insurance companies cannot consider genetic information to be a preexisting condition, nor can they use it to make decisions regarding coverage or rates. GINA also makes it illegal for most employers to use genetic information in making decisions about hiring, firing, promotion, or terms of employment. It is important to note that GINA does not offer protections for life insurance, disability insurance, or long-term care insurance. GINA does not apply to those in the [employer], those who work for companies with less than 15 employees, and new life insurance or long-term disability insurance policies.  Health status due to a cancer diagnosis is not protected under GINA. More information about GINA can be found by visiting [email or web address].be.  PLAN: After considering the risks, benefits, and limitations, Sandra Burton provided informed consent to pursue genetic testing and the blood sample was sent to [facility] for analysis of the [facility] BRCAPlus 13 gene panel, results of which should be available within approximately 7-10 days time. This will run concurrently with the [facility] CancerNext- Expanded + RNA 77 gene panel, results of which should be available within approximately 2-3 weeks. These results will be disclosed by telephone to Sandra Burton, as will any additional recommendations warranted by these results. Sandra Burton will receive a summary of her genetic counseling visit and a copy of her results once available. This information will also be available in Epic.   RESOURCES PROVIDED:  Sandra Burton was provided with the following:  [facility] Billing information  [facility] Contact card   Lastly, we encouraged Sandra Burton. Pegues to remain in contact with cancer genetics annually so that we can continuously update the family history and inform her of any changes in cancer genetics and testing that may be of benefit for this family.   Sandra Burton. Perko questions were answered to her satisfaction today. Our contact information was provided should additional questions or concerns arise. Thank you for the referral and allowing us  to share in the care of your patient.   Jarae Panas R. Bluford, Sandra Burton, [facility] Certified [email or web address] phone: [phone]  I personally spent a total of 20 minutes in the care of the patient today including preparing to see the patient, getting/reviewing separately obtained history, counseling and educating, placing orders, referring and communicating with other health care professionals, and documenting clinical information in the EHR.  The patient brought her husband, Will. Drs. Lanny Stalls, and/or Gudena were available for questions, if needed.   _______________________________________________________________________ For Office Staff:  Number of people involved in session: 2 Was an Intern/ student involved with case: no

## 2024-10-11 NOTE — Telephone Encounter (Signed)
 11/26 Sent via stat fax request for Dosimetry/Treatment records prior to 2012 from [facility].  Waiting on records.

## 2024-10-11 NOTE — Progress Notes (Signed)
 [facility] CONSULT NOTE  Patient Care Team: Chrystal Lamarr RAMAN, MD as PCP - General (Family Medicine) Jannis Kate Norris, MD as Consulting Physician (Obstetrics and Gynecology) Tyree Nanetta SAILOR, RN as Oncology Nurse Navigator Vanderbilt Ned, MD as Consulting Physician (General Surgery) Loretha Ash, MD as Consulting Physician (Hematology and Oncology) Maritza Stagger, MD as Consulting Physician (Radiation Oncology)  CHIEF COMPLAINTS/PURPOSE OF CONSULTATION:  New diagnosis of breast cancer  ASSESSMENT & PLAN:   Assessment and Plan Assessment & Plan Triple negative breast cancer, left breast, stage IB Stage IB triple negative breast cancer, left breast, with a high-grade tumor under 2.5 cm. Lymph nodes appear clear. Previous anthracycline exposure limits further use. Chemotherapy with TC regimen recommended to reduce recurrence risk adjuvantly. Cold cap therapy discussed for hair loss. Post-chemotherapy radiation considered based on surgical choice. - Administer docetaxel and cyclophosphamide (TC) chemotherapy every 21 days for 4 cycles. - Perform genetic testing to assess risk of second breast cancer. - Coordinate with plastic surgery for reconstruction options. - Discuss surgical options (lumpectomy vs. mastectomy) with surgical team. - Consider cold cap therapy for hair loss management. - Consider goserelin injections to preserve fertility during chemotherapy.  Fertility preservation counseling and risk assessment Fertility preservation considered due to chemotherapy impact.  She is undecided about future childbearing. Options include reproductive endocrinologist consultation or goserelin injections during chemotherapy. - Referred to reproductive endocrinologist for fertility preservation consultation. - Consider goserelin injections to preserve fertility during chemotherapy.     HISTORY OF PRESENTING ILLNESS:  Sandra Burton 33 y.o. female is here because of  new diagnosis of breast cancer  Discussed the use of AI scribe software for clinical note transcription with the patient, who gave verbal consent to proceed.  History of Present Illness Sandra Burton is a 33 year old female with a history of Hodgkin's lymphoma who presents with a new diagnosis of triple negative breast cancer.  She first noticed a lump in her breast approximately two months ago, which she described as swelling and then reducing in size. There has been no significant growth of the lump since it was first noticed.  Her past medical history includes Hodgkin's lymphoma, for which she was treated at the age of 1 with anthracyclines, receiving a total dose of 142 mg/m.   In terms of family history, her mother had thyroid cancer at a young age, and there is a history of pancreatic cancer on her mother's side.  She has one child, a three-year-old daughter, and is uncertain about having more children in the future. She has not undergone fertility preservation and is considering her options regarding future fertility.  She works in presenter, broadcasting.  Past Medical History:  Dx: nodular sclerosing Hodgkins lymphoma, stage IIA Date of dx: 01/13/08 Presented at 16 years with a left sided cervical/supraclavicular swelling. It did not respond to antibiotics so she underewent a lymph node biopsy on 01/13/08 by Dr. Norleen Notice (ENT, [facility]). Path showed nodular sclerosing Hodgkins lymphoma. Work up revealed no distant mets. No fever, night sweats, weight loss. Final stage = IIA. Plan is to treat with 4 courses COPP/ABV and then 21Gy Involved field radiation therapy if in complete remission after 4 courses. She was not enrolled on a COG clinical trial because she was not eligible for any open trials at the time of diagnosis (the low risk study had recently closed).Completed 4 courses chemotherapy on 05/21/08. Completed involved field radiation on 06/19/08. End rx scans on 07/30/08 show NED.  CUM chemotherapy: Doxorubicin 142 mg/m2; Cyclophosphamide 2391 mg/m2; procarbazine 2489 mg/m2; bleomycin 40 U/m2 Other chemo: vincristine; vinblastine ; prednsione RT: 21 Gy L neck and mediastinum No blood products    All other systems were reviewed with the patient and are negative.  MEDICAL HISTORY:  Past Medical History:  Diagnosis Date   Asthma    Hodgkin lymphoma (HCC)    chemo, radiation. completed 8/09. treated @ [facility] stage II   Mitral valve prolapse    Personal history of radiation therapy 2009   Hodgkin's Lymphoma    SURGICAL HISTORY: Past Surgical History:  Procedure Laterality Date   BREAST BIOPSY Left 10/02/2024   US  LT BREAST BX W LOC DEV 1ST LESION IMG BX SPEC US  GUIDE 10/02/2024 [facility] MAMMOGRAPHY   DEEP NECK LYMPH NODE BIOPSY / EXCISION     KNEE SURGERY     acl repair X 3    SOCIAL HISTORY: Social History   Socioeconomic History   Marital status: Married    Spouse name: Not on file   Number of children: Not on file   Years of education: Not on file   Highest education level: Not on file  Occupational History   Not on file  Tobacco Use   Smoking status: Never   Smokeless tobacco: Never  Vaping Use   Vaping status: Never Used  Substance and Sexual Activity   Alcohol use: Yes   Drug use: Never   Sexual activity: Yes    Partners: Male    Birth control/protection: Condom  Other Topics Concern   Not on file  Social History Narrative   Not on file   Social Drivers of Health   Financial Resource Strain: Not on file  Food Insecurity: No Food Insecurity (10/07/2024)   Hunger Vital Sign    Worried About Running Out of Food in the Last Year: Never true    Ran Out of Food in the Last Year: Never true  Transportation Needs: No Transportation Needs (10/07/2024)   PRAPARE - Administrator, Civil Service (Medical): No    Lack of Transportation (Non-Medical): No  Physical Activity: Not on file  Stress: Not on file  Social  Connections: Not on file  Intimate Partner Violence: Not on file    FAMILY HISTORY: Family History  Problem Relation Age of Onset   Thyroid disease Mother 13       thyroidectomy   Diabetes Father    Diabetes Paternal Grandmother    Diabetes Paternal Aunt    BRCA 1/2 Neg Hx    Breast cancer Neg Hx     ALLERGIES:  is allergic to sulfamethoxazole.  MEDICATIONS:  No current outpatient medications on file.   No current facility-administered medications for this visit.     PHYSICAL EXAMINATION: ECOG PERFORMANCE STATUS: 0 - Asymptomatic  Vitals:   10/11/24 0836  BP: 116/75  Pulse: 94  Resp: 16  Temp: 98.2 F (36.8 C)  SpO2: 100%   Filed Weights   10/11/24 0836  Weight: 136 lb 1.6 oz (61.7 kg)    GENERAL:alert, no distress and comfortable SKIN: skin color, texture, turgor are normal, no rashes or significant lesions EYES: normal, conjunctiva are pink and non-injected, sclera clear OROPHARYNX:no exudate, no erythema and lips, buccal mucosa, and tongue normal  NECK: supple, thyroid normal size, non-tender, without nodularity LYMPH:  no palpable lymphadenopathy in the cervical, axillary  LUNGS: clear to auscultation and percussion with normal breathing effort HEART: regular rate & rhythm and no murmurs  and no lower extremity edema ABDOMEN:abdomen soft, non-tender and normal bowel sounds Musculoskeletal:no cyanosis of digits and no clubbing  PSYCH: alert & oriented x 3 with fluent speech NEURO: no focal motor/sensory deficits  LABORATORY DATA:  I have reviewed the data as listed Lab Results  Component Value Date   WBC 13.2 (H) 06/17/2021   HGB 10.0 (L) 06/17/2021   HCT 30.6 (L) 06/17/2021   MCV 98.1 06/17/2021   PLT 168 06/17/2021     Chemistry   No results found for: NA, K, CL, CO2, BUN, CREATININE, GLU No results found for: CALCIUM, ALKPHOS, AST, ALT, BILITOT     RADIOGRAPHIC STUDIES: I have personally reviewed the radiological  images as listed and agreed with the findings in the report. US  LT BREAST BX W LOC DEV 1ST LESION IMG BX SPEC US  GUIDE Addendum Date: 10/03/2024 ADDENDUM REPORT: 10/03/2024 14:04 ADDENDUM: PATHOLOGY revealed: Breast, left, needle core biopsy, 2 o'clock, 8 cmfn, coil- INVASIVE DUCTAL CARCINOMA, SEE NOTE DUCTAL CARCINOMA IN SITU, SOLID, HIGH NUCLEAR GRADE, WITH NECROSIS OVERALL GRADE: 3. LYMPHOVASCULAR INVASION: NOT IDENTIFIED, CANCER LENGTH: 0.6 CM, CALCIFICATIONS: PRESENT. Pathology results are CONCORDANT with imaging findings, per Dr. Craig Farr. Pathology results and recommendations were discussed with patient via telephone on 10/03/2024 by Rock Hover RN. Patient reported biopsy site doing well with no adverse symptoms, and slight tenderness at the site. Post biopsy care instructions were reviewed, questions were answered and my direct phone number was provided. Patient was instructed to call [facility] for any additional questions or concerns related to biopsy site. RECOMMENDATION: 1. Surgical and oncological consultation. The patient was referred to [facility] with appointment on 10/11/24. 2. Consider pretreatment bilateral breast MRI with and without contrast given patient's relatively young age and to evaluate extent of breast disease. Pathology results reported by Rock Hover RN on 10/03/2024. Electronically Signed   By: Craig Farr M.D.   On: 10/03/2024 14:04   Result Date: 10/03/2024 CLINICAL DATA:  Left breast mass for biopsy. EXAM: ULTRASOUND GUIDED LEFT BREAST CORE NEEDLE BIOPSY COMPARISON:  Previous exam(s). PROCEDURE: I met with the patient and we discussed the procedure of ultrasound-guided biopsy, including benefits and alternatives. We discussed the high likelihood of a successful procedure. We discussed the risks of the procedure, including infection, bleeding, tissue injury, clip  migration, and inadequate sampling. Informed written consent was given. The usual time-out protocol was performed immediately prior to the procedure. Lesion quadrant: Upper-outer quadrant Using sterile technique and 1% Lidocaine  as local anesthetic, under direct ultrasound visualization, a 12 gauge spring-loaded device was used to perform biopsy of mass at left breast 2 o'clock using a lateral approach. At the conclusion of the procedure coil shaped tissue marker clip was deployed into the biopsy cavity. Follow up 2 view mammogram was performed and dictated separately. IMPRESSION: Ultrasound guided biopsy of left breast.  No apparent complications. Electronically Signed: By: Craig Farr M.D. On: 10/02/2024 09:17   MM CLIP PLACEMENT LEFT Result Date: 10/02/2024 CLINICAL DATA:  Status post ultrasound-guided core biopsy of left breast mass. EXAM: 3D DIAGNOSTIC LEFT MAMMOGRAM POST ULTRASOUND BIOPSY COMPARISON:  Previous exam(s). ACR Breast Density Category d: The breasts are extremely dense, which lowers the sensitivity of mammography. FINDINGS: 3D Mammographic images were obtained following ultrasound guided biopsy of mass at left breast 2 o'clock. The coil biopsy marking clip is in expected position at the site of biopsy. IMPRESSION: Appropriate positioning of the  coil shaped biopsy marking clip at the site of biopsy in the expected location of concern. Final Assessment: Post Procedure Mammograms for Marker Placement Electronically Signed   By: Craig Farr M.D.   On: 10/02/2024 09:18   MM 3D DIAGNOSTIC MAMMOGRAM UNILATERAL LEFT BREAST Result Date: 09/28/2024 CLINICAL DATA:  33 year old presenting with a palpable lump in the outer LEFT breast. Personal history of chest radiation in 2009 due to Hodgkin's lymphoma, placing the patient in the high risk category. EXAM: DIGITAL DIAGNOSTIC UNILATERAL LEFT MAMMOGRAM WITH TOMOSYNTHESIS AND CAD; ULTRASOUND LEFT BREAST LIMITED TECHNIQUE: Left digital diagnostic  mammography and breast tomosynthesis was performed. The images were evaluated with computer-aided detection. ; Targeted ultrasound examination of the left breast was performed. COMPARISON:  Previous exam(s). ACR Breast Density Category d: The breasts are extremely dense, which lowers the sensitivity of mammography. FINDINGS: Full field CC and MLO views and a spot tangential view of the palpable concern were obtained. No findings suspicious for malignancy. No mammographic abnormality in the outer breast in the area of palpable concern; dense breast tissue extending close to the skin surface is present in this location. Targeted ultrasound is performed in the area of palpable concern, demonstrating an oval parallel heterogeneous though predominantly hypoechoic mass with predominantly circumscribed margins, though there is shadowing along the lateral and medial edges of the mass which obscures their margins. The mass measures approximately 1.7 x 1.0 x 1.5 cm, demonstrating posterior acoustic enhancement and demonstrating internal power Doppler flow. The masses within dense fibroglandular tissue, accounting for the fact that it was inconspicuous on mammography Sonographic evaluation of the axilla demonstrates no pathologic lymphadenopathy. On correlative physical examination, there is a mobile palpable approximate 1-2 cm mass in the UPPER OUTER QUADRANT corresponding to what the patient is feeling and corresponding to the sonographic finding. IMPRESSION: Indeterminate palpable 1.7 cm mass in the UPPER OUTER QUADRANT of the LEFT breast at posterior depth. While this likely represents a fibroadenoma, given the patient's high-risk status due to prior chest irradiation, ultrasound-guided core needle biopsy is recommended. RECOMMENDATION: Ultrasound-guided core needle biopsy of the LEFT breast mass. I have discussed the findings and recommendations with the patient. The ultrasound core needle biopsy procedure was discussed  with the patient and her questions were answered she wishes to proceed with the biopsy which has been scheduled by the [facility] staff. BI-RADS CATEGORY  4: Suspicious. Electronically Signed   By: Debby Satterfield M.D.   On: 09/28/2024 15:53   US  LIMITED ULTRASOUND INCLUDING AXILLA LEFT BREAST  Result Date: 09/28/2024 CLINICAL DATA:  33 year old presenting with a palpable lump in the outer LEFT breast. Personal history of chest radiation in 2009 due to Hodgkin's lymphoma, placing the patient in the high risk category. EXAM: DIGITAL DIAGNOSTIC UNILATERAL LEFT MAMMOGRAM WITH TOMOSYNTHESIS AND CAD; ULTRASOUND LEFT BREAST LIMITED TECHNIQUE: Left digital diagnostic mammography and breast tomosynthesis was performed. The images were evaluated with computer-aided detection. ; Targeted ultrasound examination of the left breast was performed. COMPARISON:  Previous exam(s). ACR Breast Density Category d: The breasts are extremely dense, which lowers the sensitivity of mammography. FINDINGS: Full field CC and MLO views and a spot tangential view of the palpable concern were obtained. No findings suspicious for malignancy. No mammographic abnormality in the outer breast in the area of palpable concern; dense breast tissue extending close to the skin surface is present in this location. Targeted ultrasound is performed in the area of palpable concern, demonstrating an oval parallel heterogeneous though predominantly  hypoechoic mass with predominantly circumscribed margins, though there is shadowing along the lateral and medial edges of the mass which obscures their margins. The mass measures approximately 1.7 x 1.0 x 1.5 cm, demonstrating posterior acoustic enhancement and demonstrating internal power Doppler flow. The masses within dense fibroglandular tissue, accounting for the fact that it was inconspicuous on mammography Sonographic evaluation of the axilla demonstrates no pathologic  lymphadenopathy. On correlative physical examination, there is a mobile palpable approximate 1-2 cm mass in the UPPER OUTER QUADRANT corresponding to what the patient is feeling and corresponding to the sonographic finding. IMPRESSION: Indeterminate palpable 1.7 cm mass in the UPPER OUTER QUADRANT of the LEFT breast at posterior depth. While this likely represents a fibroadenoma, given the patient's high-risk status due to prior chest irradiation, ultrasound-guided core needle biopsy is recommended. RECOMMENDATION: Ultrasound-guided core needle biopsy of the LEFT breast mass. I have discussed the findings and recommendations with the patient. The ultrasound core needle biopsy procedure was discussed with the patient and her questions were answered she wishes to proceed with the biopsy which has been scheduled by the [facility] staff. BI-RADS CATEGORY  4: Suspicious. Electronically Signed   By: Debby Satterfield M.D.   On: 09/28/2024 15:53    All questions were answered. The patient knows to call the clinic with any problems, questions or concerns. I spent 45 minutes in the care of this patient including H and P, review of records, counseling and coordination of care.     Henna Stalls, MD 10/11/2024 10:07 AM

## 2024-10-11 NOTE — Therapy (Signed)
 OUTPATIENT PHYSICAL THERAPY BREAST CANCER BASELINE EVALUATION   Patient Name: Sandra Burton MRN: [id number] DOB:[birth year], 33 y.o., female Today's Date: 10/11/2024  END OF SESSION:  PT End of Session - 10/11/24 0908     Visit Number 1    Number of Visits 2    Date for Recertification  12/06/24    PT Start Time 0930    PT Stop Time 0945   Also saw pt from 958 to 1010 for a total of 27 minutes   PT Time Calculation (min) 15 min    Activity Tolerance Patient tolerated treatment well    Behavior During Therapy [facility] for tasks assessed/performed          Past Medical History:  Diagnosis Date   Asthma    Hodgkin lymphoma (HCC)    chemo, radiation. completed 8/09. treated @ [facility] stage II   Mitral valve prolapse    Personal history of radiation therapy 2009   Hodgkin's Lymphoma   Past Surgical History:  Procedure Laterality Date   BREAST BIOPSY Left 10/02/2024   US  LT BREAST BX W LOC DEV 1ST LESION IMG BX SPEC US  GUIDE 10/02/2024 [facility] MAMMOGRAPHY   DEEP NECK LYMPH NODE BIOPSY / EXCISION     KNEE SURGERY     acl repair X 3   Patient Active Problem List   Diagnosis Date Noted   Malignant neoplasm of upper-outer quadrant of left breast in female, estrogen receptor negative (HCC) 10/09/2024   Indication for care in labor and delivery, antepartum 06/16/2021   NSVD (normal spontaneous vaginal delivery) 06/16/2021   Hodgkin's disease (HCC) 06/21/2014    Class: History of   Mitral valve prolapse 03/17/2013   Asthma, mild intermittent 07/03/2012    REFERRING PROVIDER: Dr. Debby Shipper  REFERRING DIAG: Left breast cancer  THERAPY DIAG:  Malignant neoplasm of upper-outer quadrant of left breast in female, estrogen receptor negative (HCC)  History of Hodgkin's lymphoma  Rationale for Evaluation and Treatment: Rehabilitation  ONSET DATE: 09/28/2024  SUBJECTIVE:                                                                                                                                                                                            SUBJECTIVE STATEMENT: Patient reports she is here today to be seen by her medical team for her newly diagnosed left breast cancer.   PERTINENT HISTORY:  Patient was diagnosed on 09/28/2024 with left grade 3 invasive ductal carcinoma breast cancer. It measures 1.7 cm and is located in the upper outer quadrant. It is triple negative with a Ki67 of 70%. She has a history of Hodgkin's Lymphoma  in 2009 treated with chemotherapy and radiation. She has MVP and a history of 3 ACL reconstructions.   PATIENT GOALS:   reduce lymphedema risk and learn post op HEP.   PAIN:  Are you having pain? No  PRECAUTIONS: Active CA Other: Hx. Hodgkin's Lymphoma  RED FLAGS: None   HAND DOMINANCE: right  WEIGHT BEARING RESTRICTIONS: No  FALLS:  Has patient fallen in last 6 months? No  LIVING ENVIRONMENT: Patient lives with: her husband and 96 y.o. daughter Lives in: House/apartment Has following equipment at home: None  OCCUPATION: works in [employer]  LEISURE: She does Zumba, Pilates, and weightlifting about 3x/week  PRIOR LEVEL OF FUNCTION: Independent   OBJECTIVE: Note: Objective measures were completed at Evaluation unless otherwise noted.  COGNITION: Overall cognitive status: Within functional limits for tasks assessed    POSTURE:  Forward head and rounded shoulders posture  UPPER EXTREMITY AROM/PROM:  A/PROM RIGHT   eval   Shoulder extension 52  Shoulder flexion 158  Shoulder abduction 177  Shoulder internal rotation 74  Shoulder external rotation 90    (Blank rows = not tested)  A/PROM LEFT   eval  Shoulder extension 63  Shoulder flexion 156  Shoulder abduction 177  Shoulder internal rotation 74  Shoulder external rotation 90    (Blank rows = not tested)  CERVICAL AROM: All within normal limits  UPPER EXTREMITY STRENGTH: WNL  LYMPHEDEMA ASSESSMENTS (in cm):   LANDMARK RIGHT   eval  10 cm  proximal to olecranon process from proximal aspect of olecranon 24.8  Olecranon process 22.8  10 cm proximal to ulnar styloid process from proximal aspect of styloid process 20.5  Just distal to ulnar styloid process 15.2  Across hand at thumb web space 18  At base of 2nd digit 5.8  (Blank rows = not tested)  LANDMARK LEFT   eval  10 cm proximal to olecranon process from proximal aspect of olecranon 25.4   Olecranon process 22.3  10 cm proximal to ulnar styloid process from proximal aspect of styloid process 19.9  Just distal to ulnar styloid process 15  Across hand at thumb web space 17.5  At base of 2nd digit 5.6  (Blank rows = not tested)  L-DEX LYMPHEDEMA SCREENING:  The patient was assessed using the L-Dex machine today to produce a lymphedema index baseline score. The patient will be reassessed on a regular basis (typically every 3 months) to obtain new L-Dex scores. If the score is > 6.5 points away from his/her baseline score indicating onset of subclinical lymphedema, it will be recommended to wear a compression garment for 4 weeks, 12 hours per day and then be reassessed. If the score continues to be > 6.5 points from baseline at reassessment, we will initiate lymphedema treatment. Assessing in this manner has a 95% rate of preventing clinically significant lymphedema.   L-DEX FLOWSHEETS - 10/11/24 0900       L-DEX LYMPHEDEMA SCREENING   Measurement Type Unilateral    L-DEX MEASUREMENT EXTREMITY Upper Extremity    POSITION  Standing    DOMINANT SIDE Right    At Risk Side Left    BASELINE SCORE (UNILATERAL) -1          QUICK DASH SURVEY:  Junie Palin - 10/11/24 0001     Open a tight or new jar No difficulty    Do heavy household chores (wash walls, wash floors) No difficulty    Carry a shopping bag or briefcase No difficulty  Wash your back No difficulty    Use a knife to cut food No difficulty    Recreational activities in which you take some force or impact  through your arm, shoulder, or hand (golf, hammering, tennis) No difficulty    During the past week, to what extent has your arm, shoulder or hand problem interfered with your normal social activities with family, friends, neighbors, or groups? Not at all    During the past week, to what extent has your arm, shoulder or hand problem limited your work or other regular daily activities Not at all    Arm, shoulder, or hand pain. None    Tingling (pins and needles) in your arm, shoulder, or hand None    Difficulty Sleeping No difficulty    DASH Score 0 %           PATIENT EDUCATION:  Education details: Time spent educating patient on aspects of self-care to maximize post op recovery. Patient was educated on where and how to get a post op compression bra to use to reduce post op edema. Patient was also educated on the use of SOZO screenings and surveillance principles for early identification of lymphedema onset. She was instructed to use the post op pillow in the axilla for pressure and pain relief. Patient educated on lymphedema risk reduction and post op shoulder/posture HEP. Person educated: Patient Education method: Explanation, Demonstration, Handout Education comprehension: Patient verbalized understanding and returned demonstration  HOME EXERCISE PROGRAM: Patient was instructed today in a home exercise program today for post op shoulder range of motion. These included active assist shoulder flexion in sitting, scapular retraction, wall walking with shoulder abduction, and hands behind head external rotation.  She was encouraged to do these twice a day, holding 3 seconds and repeating 5 times when permitted by her physician.   ASSESSMENT:  CLINICAL IMPRESSION: Patient was diagnosed on 09/28/2024 with left grade 3 invasive ductal carcinoma breast cancer. It measures 1.7 cm and is located in the upper outer quadrant. It is triple negative with a Ki67 of 70%. She has a history of Hodgkin's  Lymphoma in 2009 treated with chemotherapy and radiation. She has MVP and a history of 3 ACL reconstructions.Her multidisciplinary medical team met prior to her assessments to determine a recommended treatment plan. She is planning to have a left lumpectomy or mastectomy with a sentinel node biopsy followed by chemotherapy and possible radiation depending on node status and radiation. She will benefit from a post op PT reassessment to determine needs and from L-Dex screens every 3 months for 2 years to detect subclinical lymphedema.  Pt will benefit from skilled therapeutic intervention to improve on the following deficits: Decreased knowledge of precautions, impaired UE functional use, pain, decreased ROM, postural dysfunction.   PT treatment/interventions: ADL/self-care home management, pt/family education, therapeutic exercise  REHAB POTENTIAL: Excellent  CLINICAL DECISION MAKING: Stable/uncomplicated  EVALUATION COMPLEXITY: Low   GOALS: Goals reviewed with patient? YES  LONG TERM GOALS: (STG=LTG)    Name Target Date Goal status  1 Pt will be able to verbalize understanding of pertinent lymphedema risk reduction practices relevant to her dx specifically related to skin care.  Baseline:  No knowledge 10/11/2024 Achieved at eval  2 Pt will be able to return demo and/or verbalize understanding of the post op HEP related to regaining shoulder ROM. Baseline:  No knowledge 10/11/2024 Achieved at eval  3 Pt will be able to verbalize understanding of the importance of viewing the post op After  Breast CA Class video for further lymphedema risk reduction education and therapeutic exercise.  Baseline:  No knowledge 10/11/2024 Achieved at eval  4 Pt will demo she has regained full shoulder ROM and function post operatively compared to baselines.  Baseline: See objective measurements taken today. 12/06/2024     PLAN:  PT FREQUENCY/DURATION: EVAL and 1 follow up appointment.   PLAN FOR NEXT  SESSION: will reassess 3-4 weeks post op to determine needs.   Patient will follow up at outpatient cancer rehab 3-4 weeks following surgery.  If the patient requires physical therapy at that time, a specific plan will be dictated and sent to the referring physician for approval. The patient was educated today on appropriate basic range of motion exercises to begin post operatively and the importance of viewing the After Breast Cancer class video following surgery.  Patient was educated today on lymphedema risk reduction practices as it pertains to recommendations that will benefit the patient immediately following surgery.  She verbalized good understanding.    Physical Therapy Information for After Breast Cancer Surgery/Treatment:  Lymphedema is a swelling condition that you may be at risk for in your arm if you have lymph nodes removed from the armpit area.  After a sentinel node biopsy, the risk is approximately 5-9% and is higher after an axillary node dissection.  There is treatment available for this condition and it is not life-threatening.  Contact your physician or physical therapist with concerns. You may begin the 4 shoulder/posture exercises (see additional sheet) when permitted by your physician (typically a week after surgery).  If you have drains, you may need to wait until those are removed before beginning range of motion exercises.  A general recommendation is to not lift your arms above shoulder height until drains are removed.  These exercises should be done to your tolerance and gently.  This is not a no pain/no gain type of recovery so listen to your body and stretch into the range of motion that you can tolerate, stopping if you have pain.  If you are having immediate reconstruction, ask your plastic surgeon about doing exercises as he or she may want you to wait. We encourage you to view the After Breast Cancer class video following surgery.  You will learn information related to  lymphedema risk, prevention and treatment and additional exercises to regain mobility following surgery.   While undergoing any medical procedure or treatment, try to avoid blood pressure being taken or needle sticks from occurring on the arm on the side of cancer.   This recommendation begins after surgery and continues for the rest of your life.  This may help reduce your risk of getting lymphedema (swelling in your arm). An excellent resource for those seeking information on lymphedema is the National Lymphedema Network's web site. It can be accessed at www.lymphnet.org If you notice swelling in your hand, arm or breast at any time following surgery (even if it is many years from now), please contact your doctor or physical therapist to discuss this.  Lymphedema can be treated at any time but it is easier for you if it is treated early on.  If you feel like your shoulder motion is not returning to normal in a reasonable amount of time, please contact your surgeon or physical therapist.  [facility] [phone]. [address], [address], [place] [address]  [facility] CLASS After Breast Cancer Class  After Breast Cancer Class is a specially designed exercise  class video to assist you in a safe recover after having breast cancer surgery.  In this video you will learn how to get back to full function whether your drains were just removed or if you had surgery a month ago. The video can be viewed on this page: https://www.boyd-meyer.org/ or on YouTube here: https://youtu.az/[email or web address].  Class Goals  Understand specific stretches to improve the flexibility of you chest and shoulder. Learn ways to safely strengthen your upper body and improve your posture. Understand the warning signs of infection and why you may be at risk for an arm infection. Learn about Lymphedema and prevention.  ** You do not need to view  this video until after surgery.  Drains should be removed to participate in the recommended exercises on the video.  Patient was instructed today in a home exercise program today for post op shoulder range of motion. These included active assist shoulder flexion in sitting, scapular retraction, wall walking with shoulder abduction, and hands behind head external rotation.  She was encouraged to do these twice a day, holding 3 seconds and repeating 5 times when permitted by her physician.  Eward Wonda Sharps,  10/11/24 10:17 AM

## 2024-10-11 NOTE — Progress Notes (Signed)
 Subjective Patient ID: Sandra Burton is a 33 y.o. female.   HPI  Patient of Drs. Cornett and Iruku here for consultation breast reconstruction. Presented with palpable left breast mass. Patient history significant for  Hodgkin's Lymphoma s/p therapeutic radiation in 2009 and has been completed screening MMG in setting of prior thoracic RT with most recent screening MMG 01/04/24, normal. Diagnostic left MMG revealed an 1.7 cm left breast mass in the UOQ.  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 Lumpectomy vs mastectomy discussed.   Breast MRI scheduled 12.5.25. She will receive adjuvant chemotherapy.  Current 35 C Desires similar.   Works in [employer] for [employer], this is hybrid position. Lives with spouse and 63 yo daughter. Both she and spouse's parents in area to assist with post op care.  Review of Systems  All other systems reviewed and are negative.   Objective Physical Exam  Cardiovascular: Normal rate.   Pulmonary/Chest Effort normal.   Skin  Fitzpatrick 2   Psychiatric She has a normal mood and affect.    Lymph: no palpable axillary adenopathy  Breasts: palpable mass left breast UOQ, no ptosis SN to nipple R 21 L 21 cm BW R 18 L 18 cm CW 14 cm Nipple to IMF R  8 L 8 cm   Abd: paucity soft tissue for reconstruction  Assessment/Plan Diagnoses and all orders for this visit:  Malignant neoplasm of upper-outer quadrant of left breast in female, ER-   History HD, therapeutic radiation  Reviewed options prosthesis and provided brochure for [facility]. Discussed immediate vs delayed, autologous vs implant based reconstruction. Reviewed incisions, drains, OR length, hospital stay and recovery for each. Discussed process of expansion and implant based risks including rupture, imaging surveillance for silicone implants, infection requiring surgery or removal, contracture. Discussed TRAM vs DIEP, latter would require treatment at tertiary center. Discussed abdominal based  complications including failure flap, abdominal bulge or hernia. Discussed future surgery dependent on adjuvant treatments. She has small donor volume for purely autologous reconstruction.   Reviewed SSM vs NSM, both will be asensate and not stimulate. Reviewed with both risks mastectomy flap necrosis requiring additional surgery. Aesthetically she is a candidate for NSM if desired.    Reviewed portfolio, examples of expanders and implants.  She will consider and let Dr. Vanderbilt know her decisions.   I have personally spent 60 minutes involved in face-to-face and non-face-to-face activities for this patient on the day of the visit.  Professional time spent includes the following activities, in addition to those noted in the documentation: record review documentation coordination care

## 2024-10-13 ENCOUNTER — Encounter: Payer: Self-pay | Admitting: *Deleted

## 2024-10-13 ENCOUNTER — Telehealth: Payer: Self-pay | Admitting: *Deleted

## 2024-10-13 NOTE — Telephone Encounter (Signed)
 Called [facility]ertility clinic - instructed to fax notes to [phone] - Marked as urgent.  For consultation regarding fertility preservation before chemo.

## 2024-10-16 ENCOUNTER — Inpatient Hospital Stay: Attending: Hematology and Oncology | Admitting: Licensed Clinical Social Worker

## 2024-10-16 NOTE — Progress Notes (Signed)
 [facility] Clinical Social Work  Initial Assessment   Sandra Burton is a 33 y.o. year old female contacted by phone. Clinical Social Work was referred by [employer] for assessment of psychosocial needs.   SDOH (Social Determinants of Health) assessments performed: Yes   SDOH Screenings   Food Insecurity: No Food Insecurity (10/07/2024)  Housing: Unknown (10/11/2024)   Received from [facility]  Transportation Needs: No Transportation Needs (10/07/2024)  Utilities: Not At Risk (10/07/2024)  Tobacco Use: Low Risk  (10/11/2024)   Received from [facility]    [facility] 2/9:     No data to display           Distress Screen completed: No     No data to display            Family/Social Information:  Housing Arrangement: patient lives with her husband and 3yo daughter Porter Family members/support persons in your life? Family (parents live 20 min away, in-laws live 5 min away), Friends, and Art Gallery Manager concerns: no  Employment: Working full time in HR. Will have short-term disability available  Income source: Employment Financial concerns: No Type of concern: None Food access concerns: no Religious or spiritual practice: Not known Advanced directives: No Services Currently in place:  [employer]  Coping/ Adjustment to diagnosis: Patient understands treatment plan and what happens next? Yes. She has some more understanding since she has been through chemo & radiation before for Hodgkin's lymphoma when she was 16.  Concerns about diagnosis and/or treatment: caring for her daughter Patient reported stressors: Children and Adjusting to my illness Hopes and/or priorities: She wants to keep things as normal as possible for her daughter who is a mommy's girl. Her parents and her in-laws are ready to help with this Patient enjoys reading and time with family/ friends Current coping skills/ strengths: Ability for insight , Capable of  independent living , Manufacturing systems engineer , Motivation for treatment/growth , Special hobby/interest , Supportive family/friends , and Work skills     SUMMARY: Current SDOH Barriers:  No major barriers identified today  Clinical Social Work Clinical Goal(s):  No clinical social work goals at this time  Interventions: Discussed common feeling and emotions when being diagnosed with cancer, and the importance of support during treatment Informed patient of the support team roles and support services at [facility] Provided CSW contact information and encouraged patient to call with any questions or concerns Provided patient with information about [employer] for family support    Follow Up Plan: Patient will contact CSW with any support or resource needs Patient verbalizes understanding of plan: Yes    Keatyn Jawad E Bennett Ram, LCSW Clinical Social Worker [facility]

## 2024-10-18 ENCOUNTER — Telehealth: Payer: Self-pay

## 2024-10-18 DIAGNOSIS — Z1379 Encounter for other screening for genetic and chromosomal anomalies: Secondary | ICD-10-CM | POA: Insufficient documentation

## 2024-10-18 NOTE — Telephone Encounter (Signed)
 I contacted  Sandra Burton to discuss her genetic testing results. No pathogenic variants were identified in the 13 genes analyzed. Discussed that these are the results for the STAT panel and once the expanded panel result are in well will call to discuss those results.  The test report will be scanned into EPIC and will be located under the Molecular Pathology section of the Results Review tab.  A portion of the result report is included below for reference.

## 2024-10-19 ENCOUNTER — Telehealth: Payer: Self-pay | Admitting: *Deleted

## 2024-10-19 ENCOUNTER — Encounter: Payer: Self-pay | Admitting: *Deleted

## 2024-10-19 NOTE — Telephone Encounter (Signed)
 Spoke with patient to follow up from [facility] 11/26 and assess navigation needs.  Patient denies any questions or concerns at this time.  She did let me know that the Fertility clinic at [facility] had reached out but she has not made the appt yet, but plans to do that.  Encouraged her to call should anything arise. Patient verbalized understanding.

## 2024-10-20 ENCOUNTER — Telehealth: Payer: Self-pay | Admitting: Radiation Oncology

## 2024-10-20 ENCOUNTER — Ambulatory Visit (HOSPITAL_COMMUNITY)
Admission: RE | Admit: 2024-10-20 | Discharge: 2024-10-20 | Disposition: A | Source: Ambulatory Visit | Attending: Surgery | Admitting: Surgery

## 2024-10-20 DIAGNOSIS — C50412 Malignant neoplasm of upper-outer quadrant of left female breast: Secondary | ICD-10-CM

## 2024-10-20 MED ORDER — GADOBUTROL 1 MMOL/ML IV SOLN
6.0000 mL | Freq: Once | INTRAVENOUS | Status: AC | PRN
  Administered 2024-10-20: 6 mL via INTRAVENOUS

## 2024-10-20 NOTE — Telephone Encounter (Signed)
 12/5 Follow up call to [facility] Records Dept; spoke to Dasia; requested received, but unable to filled was forward to [facility] Radiation Oncology dept.  Left voicemail with Erminio A.  Waiting on call back and/or dosimetry/treatment records.

## 2024-10-23 ENCOUNTER — Encounter: Payer: Self-pay | Admitting: Hematology and Oncology

## 2024-10-23 ENCOUNTER — Ambulatory Visit: Payer: Self-pay

## 2024-10-23 ENCOUNTER — Ambulatory Visit: Payer: Self-pay | Admitting: Surgery

## 2024-10-23 ENCOUNTER — Telehealth: Payer: Self-pay

## 2024-10-23 ENCOUNTER — Encounter: Payer: Self-pay | Admitting: *Deleted

## 2024-10-23 DIAGNOSIS — Z171 Estrogen receptor negative status [ER-]: Secondary | ICD-10-CM

## 2024-10-23 DIAGNOSIS — Z8042 Family history of malignant neoplasm of prostate: Secondary | ICD-10-CM

## 2024-10-23 DIAGNOSIS — Z800 Family history of malignant neoplasm of digestive organs: Secondary | ICD-10-CM

## 2024-10-23 DIAGNOSIS — Z1379 Encounter for other screening for genetic and chromosomal anomalies: Secondary | ICD-10-CM

## 2024-10-23 DIAGNOSIS — Z808 Family history of malignant neoplasm of other organs or systems: Secondary | ICD-10-CM

## 2024-10-23 NOTE — Progress Notes (Signed)
 HPI:  Sandra Burton was previously seen in the [facility] due to a personal history of breast cancer and concerns regarding a hereditary predisposition to cancer. Please refer to our prior cancer genetics clinic note for more information regarding our discussion, assessment and recommendations, at the time. Ms. Blok recent genetic test results were disclosed to her, as were recommendations warranted by these results. These results and recommendations are discussed in more detail below.  CANCER HISTORY:  Oncology History  Malignant neoplasm of upper-outer quadrant of left breast in female, estrogen receptor negative (HCC)  10/09/2024 Initial Diagnosis   Malignant neoplasm of upper-outer quadrant of left breast in female, estrogen receptor negative (HCC)   10/11/2024 Cancer Staging   Staging form: Breast, AJCC 8th Edition - Clinical stage from 10/11/2024: Stage IB (cT1c, cN0, cM0, G3, ER-, PR-, HER2-) - Signed by Loretha Ash, MD on 10/11/2024 Stage prefix: Initial diagnosis Histologic grading system: 3 grade system Laterality: Left Staged by: Pathologist and managing physician Stage used in treatment planning: Yes National guidelines used in treatment planning: Yes Type of national guideline used in treatment planning: NCCN   10/17/2024 Genetic Testing   Update 10/23/2024: Negative [facility]- Expanded + RNA insight. Report date 10/19/2024. 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 at c.[id number]  The [facility] Cancer[facility]-Expanded + RNAinsight gene panel includes sequencing, rearrangement, and RNA analysis for the following 77 genes: AIP, ALK, APC, ATM, AXIN2, BAP1, BARD1, BMPR1A, BRCA1, BRCA2, BRIP1, CDC73, CDH1, CDK4, CDKN1B, CDKN2A, CEBPA, CHEK2, CTNNA1, DDX41, DICER1, ETV6, FH, FLCN, GATA2, LZTR1, MAX, MBD4, MEN1, MET, MLH1, MSH2, MSH3, MSH6, MUTYH, NF1, NF2, NTHL1, PALB2, PHOX2B, PMS2, POT1, PRKAR1A, PTCH1, PTEN, RAD51C, RAD51D, RB1, RET, RPS20, RUNX1, SDHA, SDHAF2, SDHB,  SDHC, SDHD, SMAD4, SMARCA4, SMARCB1, SMARCE1, STK11, SUFU, TMEM127, TP53, TSC1, TSC2, VHL, and WT1 (sequencing and deletion/duplication); EGFR, HOXB13, KIT, MITF, PDGFRA, POLD1, and POLE (sequencing only); EPCAM and GREM1 (deletion/duplication only).    Negative genetic testing. Report date is 10/17/2024.  [facility]lus gene panel which includes sequencing and rearrangement analysis for the following 13 genes: ATM, BARD1, BRCA1, BRCA2, CDH1, CHEK2, NF1, PALB2, PTEN, RAD51C, RAD51D, STK11 and TP53 (sequencing and deletion/duplication)      FAMILY HISTORY:  We obtained a detailed, 4-generation family history.  Significant diagnoses are listed below: Family History  Problem Relation Age of Onset   Thyroid disease Mother 61       thyroidectomy   Diabetes Father    Diabetes Paternal Grandmother    Diabetes Paternal Aunt    BRCA 1/2 Neg Hx    Breast cancer Neg Hx      Ms. Silveria reports the following family history: Her mother was diagnosed with thyroid cancer when she was 75. She had a thyroidectomy and radioactive iodine for treatment of this cancer. Her maternal aunt had cancer in her 40s but Ms. Bartee is unsure what type of cancer this was.  Her maternal grandfather had prostate cancer in his 39s. Her maternal great grandmother had pancreatic cancer. She does not report any paternal history of cancer.    Ms. Winzer is unaware of previous family history of genetic testing for hereditary cancer risks. There is no reported Ashkenazi Jewish ancestry. There is no known consanguinity.  GENETIC TEST RESULTS: Genetic testing reported out on 10/19/2024 through the [facility] Cancer[facility]- Expanded + RNA 77 gene cancer panel found no pathogenic mutations.   The results of Ms. Deshotels's genetic testing were disclosed in two parts. The [facility] BRCAPlus 13 gene panel was disclosed first  and was negative. This was reported on 10/17/2024 and her care team was made aware. The [facility] BR[facility]lus gene panel which included  sequencing and rearrangement analysis for the following 13 genes: ATM, BARD1, BRCA1, BRCA2, CDH1, CHEK2, NF1, PALB2, PTEN, RAD51C, RAD51D, STK11 and TP53 (sequencing and deletion/duplication). The results of this were resulted first so they could be used for medical decision-making.  The [facility] CancerNext-Expanded + RNAinsight gene panel includes sequencing, rearrangement, and RNA analysis for the following 77 genes: AIP, ALK, APC, ATM, AXIN2, BAP1, BARD1, BMPR1A, BRCA1, BRCA2, BRIP1, CDC73, CDH1, CDK4, CDKN1B, CDKN2A, CEBPA, CHEK2, CTNNA1, DDX41, DICER1, ETV6, FH, FLCN, GATA2, LZTR1, MAX, MBD4, MEN1, MET, MLH1, MSH2, MSH3, MSH6, MUTYH, NF1, NF2, NTHL1, PALB2, PHOX2B, PMS2, POT1, PRKAR1A, PTCH1, PTEN, RAD51C, RAD51D, RB1, RET, RPS20, RUNX1, SDHA, SDHAF2, SDHB, SDHC, SDHD, SMAD4, SMARCA4, SMARCB1, SMARCE1, STK11, SUFU, TMEM127, TP53, TSC1, TSC2, VHL, and WT1 (sequencing and deletion/duplication); EGFR, HOXB13, KIT, MITF, PDGFRA, POLD1, and POLE (sequencing only); EPCAM and GREM1 (deletion/duplication only).   The test report has been scanned into EPIC and is located under the Molecular Pathology section of the Results Review tab.  A portion of the result report is included below for reference.     We discussed with Ms. Brewbaker that because current genetic testing is not perfect, it is possible there may be a gene mutation in one of these genes that current testing cannot detect, but that chance is small.  We also discussed, that there could be another gene that has not yet been discovered, or that we have not yet tested, that is responsible for the cancer diagnoses in the family. It is also possible there is a hereditary cause for the cancer in the family that Ms. Ghazi did not inherit and therefore was not identified in her testing.  Therefore, it is important to remain in touch with cancer genetics in the future so that we can continue to offer Ms. Schwenn the most up to date genetic testing.   Genetic testing  did identify a variant of uncertain significance (VUS) was identified in the LZTR1 gene called c.[id number] A>G.  At this time, it is unknown if this variant is associated with increased cancer risk or if this is a normal finding, but most variants such as this get reclassified to being inconsequential. It should not be used to make medical management decisions. With time, we suspect the lab will determine the significance of this variant, if any. If we do learn more about it, we will try to contact Ms. Leer to discuss it further. However, it is important to stay in touch with us  periodically and keep the address and phone number up to date.  ADDITIONAL GENETIC TESTING: We discussed with Ms. Skow that her genetic testing was fairly extensive.  If there are genes identified to increase cancer risk that can be analyzed in the future, we would be happy to discuss and coordinate this testing at that time.    CANCER SCREENING RECOMMENDATIONS: Ms. Westhoff test result is considered negative (normal).  This means that we have not identified a hereditary cause for her personal and family history of cancer at this time. Most cancers happen by chance and this negative test suggests that her personal and family history of cancer may fall into this category.    Possible reasons for Ms. Cedano's negative genetic test include:  1. There may be a gene mutation in one of these genes that current testing methods cannot detect but that chance is small.  2. There could be another gene that has not yet been discovered, or that we have not yet tested, that is responsible for the cancer diagnoses in the family.  3.  There may be no hereditary risk for cancer in the family. The cancers in Ms. Alderfer and/or her family may be sporadic/familial or due to other genetic and environmental factors. 4. It is also possible there is a hereditary cause for the cancer in the family that Ms. Lecuyer did not inherit.  Therefore, it is recommended  she continue to follow the cancer management and screening guidelines provided by her oncology and primary healthcare provider. An individual's cancer risk and medical management are not determined by genetic test results alone. Overall cancer risk assessment incorporates additional factors, including personal medical history, family history, and any available genetic information that may result in a personalized plan for cancer prevention and surveillance  An individual's cancer risk and medical management are not determined by genetic test results alone. Overall cancer risk assessment incorporates additional factors, including personal medical history, family history, and any available genetic information that may result in a personalized plan for cancer prevention and surveillance.  RECOMMENDATIONS FOR FAMILY MEMBERS:   Since she did not inherit a identifiable mutation in a cancer predisposition gene included on this panel, her children could not have inherited a known mutation from her in one of these genes. Individuals in this family might be at some increased risk of developing cancer, over the general population risk, simply due to the family history of cancer.  We recommended women in this family have a yearly mammogram beginning at age 43, or 35 years younger than the earliest onset of cancer, an annual clinical breast exam, and perform monthly breast self-exams. Women in this family should also have a gynecological exam as recommended by their primary provider. All family members should be referred for colonoscopy starting at age 86, or 69 years younger than the earliest onset of cancer.  FOLLOW-UP: Lastly, we discussed with Ms. Devita that cancer genetics is a rapidly advancing field and it is possible that new genetic tests will be appropriate for her and/or her family members in the future. We encouraged her to remain in contact with cancer genetics on an annual basis so we can update her  personal and family histories and let her know of advances in cancer genetics that may benefit this family.   Our contact number was provided. Ms. Lien questions were answered to her satisfaction, and she knows she is welcome to call us  at anytime with additional questions or concerns.   Warren Ahle, MS, [facility] Cancer Genetic Counselor [email or web address] [phone]

## 2024-10-23 NOTE — Telephone Encounter (Signed)
 I contacted Ms. Secrest to discuss her genetic testing results. The test that was ordered was the [facility] CancerNext-Expanded 77 gene panel. We discussed that the negative results from the [facility] BRCAplus 13 gene panel had already been reported out. No pathogenic variants were identified in the rest of the 64 genes analyzed. Detailed clinic note to follow.  The test report has been scanned into EPIC and is located under the Molecular Pathology section of the Results Review tab.  A portion of the result report is included below for reference.       Warren Ahle, MS, [facility] Cancer Genetic Counselor [email or web address] [phone]

## 2024-10-24 ENCOUNTER — Telehealth: Payer: Self-pay | Admitting: Radiation Oncology

## 2024-10-24 NOTE — Telephone Encounter (Signed)
 12/9 Received call back from Erminio A., [facility], cannot release records without sign consent from patient.  Patient is aware and working on it.  Waiting on Dosimetry/treatment records.

## 2024-10-25 ENCOUNTER — Other Ambulatory Visit (HOSPITAL_COMMUNITY)

## 2024-11-01 ENCOUNTER — Encounter: Payer: Self-pay | Admitting: *Deleted

## 2024-11-02 NOTE — H&P (Signed)
 Subjective Patient ID: Sandra Burton is a 33 y.o. female.     HPI   Returns for follow up discussion breast reconstruction. Presented with palpable left breast mass. Patient history significant for  Hodgkin's Lymphoma s/p therapeutic radiation in 2009 and has been completed screening MMG in setting of prior thoracic RT with most recent screening MMG 01/04/24, normal. Diagnostic left MMG revealed an 1.7 cm left breast mass in the UOQ.  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    MRI demonstrated 1.8 cm biopsy-proven malignancy in the posterior upper outer left breast.   Patient has elected for bilateral mastecomies.She will receive adjuvant chemotherapy.   Current 36 C Desires similar.    Works in [employer], this is hybrid position. Lives with spouse and 62 yo daughter. Both she and spouse's parents in area to assist with post op care.   Review of Systems  All other systems reviewed and are negative.    Objective Physical Exam  Cardiovascular: Normal rate, regular rhythm and normal heart sounds.    Pulmonary/Chest Effort normal and breath sounds normal.    Skin   Fitzpatrick 2     Lymph: no palpable axillary adenopathy   Breasts: palpable mass left breast UOQ, no ptosis SN to nipple R 21 L 21 cm BW R 18 L 18 cm CW 14 cm Nipple to IMF R  8 L 8 cm    Abd: paucity soft tissue for reconstruction   Assessment/Plan Malignant neoplasm of upper-outer quadrant of left breast in female, ER-   History HD, therapeutic radiation   Plan bilateral nipple sparing mastectomies with tissue expander acellular dermis reconstruction.   Reviewed options prosthesis and provided brochure for [facility] [place]. Discussed immediate vs delayed, autologous vs implant based reconstruction. Reviewed incisions, drains, OR length, hospital stay and recovery for each. Discussed process of expansion and implant based risks including rupture, imaging surveillance for silicone implants, infection  requiring surgery or removal, contracture. Discussed TRAM vs DIEP, latter would require treatment at tertiary center. Discussed abdominal based complications including failure flap, abdominal bulge or hernia. Discussed future surgery dependent on adjuvant treatments. She has small donor volume for purely autologous reconstruction. Reviewed implants are not permanent devices and will require additional surgery.    Reviewed SSM vs NSM, both will be asensate and not stimulate. Reviewed with both risks mastectomy flap necrosis requiring additional surgery.    Discussed use of acellular dermis in reconstruction, cadaveric source, incorporation over several weeks, risk that if has seroma or infection can act as additional nidus for infection if not incorporated.    Discussed prepectoral vs sub pectoral reconstruction. Discussed with patient and benefit of this is no animation deformity, may be less pain. Risk may be more visible rippling over upper poles, greater need of ADM. Reviewed pre pectoral would require larger amount acellular dermis, more drains. Discussed any type reconstruction also risks long term displacement implant and visible rippling. If prepectoral counseled I would recommend she be comfortable with silicone implants as more options that have less rippling. She agrees to prepectoral placement.   Reviewed reconstruction will be asensate and not stimulate. Reviewed additional risks including but not limited to risks seroma, hematoma, asymmetry, need to additional procedures, fat necrosis, DVT/PE, damage to adjacent structures, cardiopulmonary complications.   Drain teaching completed. Rx for robaxin oxycodone  and doxycycline given.   Earlis Ranks, MD [facility]  Office/ physician access line after hours [phone]

## 2024-11-03 ENCOUNTER — Telehealth: Payer: Self-pay | Admitting: Hematology and Oncology

## 2024-11-03 NOTE — Telephone Encounter (Signed)
 I spoke with patient to schedule chemo education, pharmacy clinic education, and post on with MD on 12/14/2024. Patient aware of date/time change.

## 2024-11-15 ENCOUNTER — Encounter (HOSPITAL_BASED_OUTPATIENT_CLINIC_OR_DEPARTMENT_OTHER): Payer: Self-pay | Admitting: Surgery

## 2024-11-15 ENCOUNTER — Other Ambulatory Visit: Payer: Self-pay

## 2024-11-17 MED ORDER — CHLORHEXIDINE GLUCONATE CLOTH 2 % EX PADS: 6.0000 | MEDICATED_PAD | Freq: Once | CUTANEOUS | Status: DC

## 2024-11-17 NOTE — Progress Notes (Signed)

## 2024-11-21 NOTE — H&P (Signed)
 History of Present Illness: Sandra Burton is a 34 y.o. female who is seen today as an office consultation for evaluation of Breast Cancer  34 year old female seen today in the [facility] due to a left breast mass. She noticed it about 2 months ago and it got larger. Mammogram and ultrasound performed which shows a 1.5 cm mass left breast upper outer quadrant. 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 History of previous chest radiation due to lymphoma as a teenager in 2009. She has no complaints today.  Review of Systems: A complete review of systems was obtained from the patient. I have reviewed this information and discussed as appropriate with the patient. See HPI as well for other ROS.    Medical History: Past Medical History:  Diagnosis Date  Anemia  Asthma, unspecified asthma severity, unspecified whether complicated, unspecified whether persistent (HHS-HCC)  History of cancer  Hodgkin's Lymphoma   Patient Active Problem List  Diagnosis  Malignant neoplasm of upper-outer quadrant of left breast in female, estrogen receptor negative (CMS/HHS-HCC)  Hodgkin's disease (CMS/HHS-HCC)   Past Surgical History:  Procedure Laterality Date  .Left Breast Biopsy Left 10/02/2024  DEEP NECK LYMPH NODE BIOPSY / EXCISION N/A  Knee surgery    Allergies  Allergen Reactions  Sulfadimethoxine Unknown  As a child   Current Outpatient Medications on File Prior to Visit  Medication Sig Dispense Refill  JUNEL FE 1.5/30, 28, 1.5 mg-30 mcg (21)/75 mg (7) tablet Take 1 tablet by mouth once daily as directed  multivitamin tablet Take 1 tablet by mouth once daily   No current facility-administered medications on file prior to visit.   Family History  Problem Relation Age of Onset  Skin cancer Mother  Diabetes Father    Social History   Tobacco Use  Smoking Status Never  Smokeless Tobacco Never    Social History    Socioeconomic History  Marital status: Married  Tobacco Use  Smoking status: Never  Smokeless tobacco: Never  Vaping Use  Vaping status: Never Used  Substance and Sexual Activity  Drug use: Never   Social Drivers of Health   Food Insecurity: No Food Insecurity (10/07/2024)  Received [facility]  Hunger Vital Sign  Within the past 12 months, you worried that your food would run out before you got the money to buy more.: Never true  Within the past 12 months, the food you bought just didn't last and you didn't have money to get more.: Never true  Transportation Needs: No Transportation Needs (10/07/2024)  Received from [facility] - Transportation  In the past 12 months, has lack of transportation kept you from medical appointments or from getting medications?: No  In the past 12 months, has lack of transportation kept you from meetings, work, or from getting things needed for daily living?: No  Housing Stability: Unknown (10/11/2024)  Housing Stability Vital Sign  Homeless in the Last Year: No   Objective:  There were no vitals filed for this visit.  There is no height or weight on file to calculate BMI.  Physical Exam Vitals reviewed. Exam conducted with a chaperone present.  Cardiovascular:  Rate and Rhythm: Normal rate.  Chest:  Breasts: Right: Normal.  Left: Mass present.   Musculoskeletal:  Cervical back: Normal range of motion.  Lymphadenopathy:  Upper Body:  Right upper body: No supraclavicular or axillary adenopathy.  Left upper body: No supraclavicular or axillary adenopathy.  Skin: General: Skin is warm.  Neurological:  General: No focal deficit present.  Mental Status: She is alert.  Psychiatric:  Mood and Affect: Mood normal.     Labs, Imaging and Diagnostic Testing:  FINAL DIAGNOSIS   1. Breast, left, needle core biopsy, 2 o'clock, 8cmfn, coil :  INVASIVE DUCTAL CARCINOMA, SEE NOTE  DUCTAL CARCINOMA IN SITU, SOLID, HIGH  NUCLEAR GRADE, WITH NECROSIS  TUBULE FORMATION: SCORE 3  NUCLEAR PLEOMORPHISM: SCORE 3  MITOTIC COUNT: SCORE 3  TOTAL SCORE: 9  OVERALL GRADE: 3  LYMPHOVASCULAR INVASION: NOT IDENTIFIED  CANCER LENGTH: 0.6 CM  CALCIFICATIONS: PRESENT  OTHER FINDINGS: NONE  SEE NOTE   Diagnosis Note : Dr. LeGolvan reviewed the case and concurs with the  interpretation. A breast prognostic profile (ER, PR, Ki-67 and HER2) is pending  and will be reported in an addendum. [facility] was notified  on 10/03/2024.   CORRECTED  [id number]: Report corrected to correct the overall grade from 9 to 3. No other changes made to this report. kh 10/10/24 10:25:32 AM   DATE SIGNED OUT: 10/03/2024  ELECTRONIC SIGNATURE : Belvie Come, John, Pathologist, Electronic Signature   MICROSCOPIC DESCRIPTION   CASE COMMENTS  STAINS USED IN DIAGNOSIS:  H&E-4  *RECUT 1 SLIDE  H&E  H&E-3  H&E-2  Stains used in diagnosis 1 KI-67-ACIS, 1 ER-ACIS, 1 PR-ACIS, 1 Her2 by IHC  Ki-67 (MM1), immunohistochemical stains are performed on formalin fixed,  paraffin embedded tissue using a 3,3-diaminobenzidine (DAB) chromogen and Leica  Bond Autostainer System. The staining intensity of the nucleus is scored  manually and is reported as the percentage of tumor cell nuclei demonstrating  specific nuclear staining.Specimens are fixed in 10% Neutral Buffered Formalin  for at least 6 hours and up to 72 hours. These tests have not be validated on  decalcified tissue. Results should be interpreted with caution given the  possibility of false negative results on decalcified specimens.  Estrogen receptor (6F11), immunohistochemical stains are performed on formalin  fixed, paraffin embedded tissue using a 3,3-diaminobenzidine (DAB) chromogen  and Leica Bond Autostainer System. The staining intensity of the nucleus is  scored manually and is reported as the percentage of tumor cell nuclei  demonstrating specific nuclear  staining.Specimens are fixed in 10% Neutral  Buffered Formalin for at least 6 hours and up to 72 hours. These tests have not  be validated on decalcified tissue. Results should be interpreted with caution  given the possibility of false negative results on decalcified specimens.  PR progesterone receptor (16), immunohistochemical stains are performed on  formalin fixed, paraffin embedded tissue using a 3,3-diaminobenzidine (DAB)  chromogen and Leica Bond Autostainer System. The staining intensity of the  nucleus is scored manually and is reported as the percentage of tumor cell  nuclei demonstrating specific nuclear staining.Specimens are fixed in 10%  Neutral Buffered Formalin for at least 6 hours and up to 72 hours. These tests  have not be validated on decalcified tissue. Results should be interpreted with  caution given the possibility of false negative results on decalcified  specimens.  IHC scores are reported using ASCO/CAP scoring criteria. An IHC Score of 0 or  1+ is NEGATIVE for HER2, 3+ is POSITIVE for HER2, and 2+ is EQUIVOCAL.  Equivocal results are reflexed to either FISH or IHC testing. Specimens are  fixed in 10% Neutral Buffered Formalin for at least 6 hours and up to 72 hours.  These tests have not be validated on decalcified tissue.  Results should be  interpreted with caution given the possibility of false negative results on  decalcified specimens. Antibody Clone for HER2 is 4B5 ([facility]). Some of these  immunohistochemical stains may have been developed and the performance  characteristics determined by [facility]. Some may not have been  cleared or approved by the U.S. Food and Drug Administration. The FDA has  determined that such clearance or approval is not necessary. This test is used  for clinical purposes. It should not be regarded as investigational or for  research. This laboratory is certified under the Clinical Laboratory  Improvement  Amendments of 1988 (CLIA-88) as qualified to perform high complexity  clinical laboratory testing.   ADDENDUM  Breast, left, needle core biopsy, 2 o'clock, 8cmfn, coil  PROGNOSTIC INDICATORS   Results:  IMMUNOHISTOCHEMICAL AND MORPHOMETRIC ANALYSIS PERFORMED MANUALLY  The tumor cells are NEGATIVE for Her2 (0).  Estrogen Receptor: 0%, NEGATIVE  Progesterone Receptor: 0%, NEGATIVE  Proliferation Marker Ki67: 70%  COMMENT: The negative hormone receptor study(ies) in this case has no internal positive control.   REFERENCE RANGE ESTROGEN RECEPTOR  NEGATIVE 0%  POSITIVE =>1%  REFERENCE RANGE PROGESTERONE RECEPTOR  NEGATIVE 0%  POSITIVE =>1%  All controls stained appropriately  Belvie Come, John, Pathologist, Electronic Signature  ( Signed [phone] [id number])  CLINICAL DATA: 34 year old presenting with a palpable lump in the  outer LEFT breast. Personal history of chest radiation in 2009 due  to Hodgkin's lymphoma, placing the patient in the high risk  category.   EXAM:  DIGITAL DIAGNOSTIC UNILATERAL LEFT MAMMOGRAM WITH TOMOSYNTHESIS AND  CAD; ULTRASOUND LEFT BREAST LIMITED   TECHNIQUE:  Left digital diagnostic mammography and breast tomosynthesis was  performed. The images were evaluated with computer-aided detection.  ; Targeted ultrasound examination of the left breast was performed.   COMPARISON: Previous exam(s).   ACR Breast Density Category d: The breasts are extremely dense,  which lowers the sensitivity of mammography.   FINDINGS:  Full field CC and MLO views and a spot tangential view of the  palpable concern were obtained.   No findings suspicious for malignancy. No mammographic abnormality  in the outer breast in the area of palpable concern; dense breast  tissue extending close to the skin surface is present in this  location.   Targeted ultrasound is performed in the area of palpable concern,  demonstrating an oval parallel heterogeneous though predominantly   hypoechoic mass with predominantly circumscribed margins, though  there is shadowing along the lateral and medial edges of the mass  which obscures their margins. The mass measures approximately 1.7 x  1.0 x 1.5 cm, demonstrating posterior acoustic enhancement and  demonstrating internal power Doppler flow. The masses within dense  fibroglandular tissue, accounting for the fact that it was  inconspicuous on mammography   Sonographic evaluation of the axilla demonstrates no pathologic  lymphadenopathy.   On correlative physical examination, there is a mobile palpable  approximate 1-2 cm mass in the UPPER OUTER QUADRANT corresponding to  what the patient is feeling and corresponding to the sonographic  finding.   IMPRESSION:  Indeterminate palpable 1.7 cm mass in the UPPER OUTER QUADRANT of  the LEFT breast at posterior depth. While this likely represents a  fibroadenoma, given the patient's high-risk status due to prior  chest irradiation, ultrasound-guided core needle biopsy is  recommended.   RECOMMENDATION:  Ultrasound-guided core needle biopsy of the LEFT breast mass.   I have discussed the findings and recommendations with the patient.  The ultrasound core needle biopsy procedure was discussed with the  patient and her questions were answered she wishes to proceed with  the biopsy which has been scheduled by [facility].   BI-RADS CATEGORY 4: Suspicious.   Assessment and Plan:   Diagnoses and all orders for this visit:  Malignant neoplasm of upper-outer quadrant of left breast in female, estrogen receptor negative (CMS/HHS-HCC)  Hodgkin lymphoma of intrathoracic lymph nodes, unspecified Hodgkin lymphoma type (CMS/HHS-HCC)   Reviewed overall pathophysiology of triple negative breast cancer.  Recommend MRI due to breast density and for surgical planning  She is deciding between mastectomy versus breast conserving surgery with  reconstruction. Refer to plastic surgery  Further care once evaluations are complete  Recommend genetics  May need a port at some point.   DEBBY CURTISTINE SHIPPER, MD

## 2024-11-22 NOTE — Anesthesia Preprocedure Evaluation (Addendum)
"                                    Anesthesia Evaluation  Patient identified by MRN, date of birth, ID band Patient awake    Reviewed: Allergy & Precautions, NPO status , Patient's Chart, lab work & pertinent test results  Airway Mallampati: I  TM Distance: >3 FB Neck ROM: Full    Dental  (+) Teeth Intact, Dental Advisory Given   Pulmonary asthma    Pulmonary exam normal breath sounds clear to auscultation       Cardiovascular Normal cardiovascular exam+ Valvular Problems/Murmurs MVP  Rhythm:Regular Rate:Normal     Neuro/Psych negative neurological ROS  negative psych ROS   GI/Hepatic negative GI ROS, Neg liver ROS,,,  Endo/Other  negative endocrine ROS    Renal/GU negative Renal ROS     Musculoskeletal negative musculoskeletal ROS (+)    Abdominal   Peds  Hematology Hodgkin lymphoma   Anesthesia Other Findings Malignant neoplasm of upper-outer quadrant of left female breast s/p chemo/radiation 2009  Reproductive/Obstetrics                              Anesthesia Physical Anesthesia Plan  ASA: 3  Anesthesia Plan: General   Post-op Pain Management: Tylenol  PO (pre-op)*, Toradol  IV (intra-op)* and Regional block*   Induction: Intravenous  PONV Risk Score and Plan: 3 and Scopolamine  patch - Pre-op, Midazolam , Dexamethasone  and Ondansetron   Airway Management Planned: Oral ETT  Additional Equipment:   Intra-op Plan:   Post-operative Plan: Extubation in OR  Informed Consent: I have reviewed the patients History and Physical, chart, labs and discussed the procedure including the risks, benefits and alternatives for the proposed anesthesia with the patient or authorized representative who has indicated his/her understanding and acceptance.     Dental advisory given  Plan Discussed with: CRNA  Anesthesia Plan Comments:          Anesthesia Quick Evaluation  "

## 2024-11-23 ENCOUNTER — Ambulatory Visit (HOSPITAL_COMMUNITY)

## 2024-11-23 ENCOUNTER — Ambulatory Visit (HOSPITAL_BASED_OUTPATIENT_CLINIC_OR_DEPARTMENT_OTHER): Payer: Self-pay

## 2024-11-23 ENCOUNTER — Ambulatory Visit (HOSPITAL_BASED_OUTPATIENT_CLINIC_OR_DEPARTMENT_OTHER)
Admission: RE | Admit: 2024-11-23 | Discharge: 2024-11-24 | Disposition: A | Discharge status: Home / Self Care | Attending: Plastic Surgery | Admitting: Plastic Surgery

## 2024-11-23 ENCOUNTER — Other Ambulatory Visit: Payer: Self-pay

## 2024-11-23 ENCOUNTER — Encounter (HOSPITAL_BASED_OUTPATIENT_CLINIC_OR_DEPARTMENT_OTHER)
Admission: RE | Disposition: A | Discharge status: Home / Self Care | Payer: Self-pay | Source: Home / Self Care | Attending: Plastic Surgery

## 2024-11-23 ENCOUNTER — Encounter (HOSPITAL_BASED_OUTPATIENT_CLINIC_OR_DEPARTMENT_OTHER): Payer: Self-pay | Admitting: Surgery

## 2024-11-23 DIAGNOSIS — Z9221 Personal history of antineoplastic chemotherapy: Secondary | ICD-10-CM | POA: Insufficient documentation

## 2024-11-23 DIAGNOSIS — Z8571 Personal history of Hodgkin lymphoma: Secondary | ICD-10-CM | POA: Insufficient documentation

## 2024-11-23 DIAGNOSIS — Z923 Personal history of irradiation: Secondary | ICD-10-CM | POA: Insufficient documentation

## 2024-11-23 DIAGNOSIS — J45909 Unspecified asthma, uncomplicated: Secondary | ICD-10-CM | POA: Insufficient documentation

## 2024-11-23 DIAGNOSIS — Z171 Estrogen receptor negative status [ER-]: Secondary | ICD-10-CM | POA: Diagnosis present

## 2024-11-23 DIAGNOSIS — Z01818 Encounter for other preprocedural examination: Secondary | ICD-10-CM

## 2024-11-23 DIAGNOSIS — C50412 Malignant neoplasm of upper-outer quadrant of left female breast: Secondary | ICD-10-CM | POA: Diagnosis present

## 2024-11-23 DIAGNOSIS — C773 Secondary and unspecified malignant neoplasm of axilla and upper limb lymph nodes: Secondary | ICD-10-CM | POA: Insufficient documentation

## 2024-11-23 DIAGNOSIS — C50912 Malignant neoplasm of unspecified site of left female breast: Secondary | ICD-10-CM | POA: Diagnosis present

## 2024-11-23 HISTORY — PX: SENTINEL NODE BIOPSY: SHX6608

## 2024-11-23 HISTORY — PX: PORTACATH PLACEMENT: SHX2246

## 2024-11-23 HISTORY — PX: BREAST RECONSTRUCTION: SHX9

## 2024-11-23 HISTORY — PX: NIPPLE SPARING MASTECTOMY: SHX6537

## 2024-11-23 LAB — POCT PREGNANCY, URINE: Preg Test, Ur: NEGATIVE

## 2024-11-23 SURGERY — MASTECTOMY, NIPPLE SPARING
Anesthesia: General | Site: Chest | Laterality: Right

## 2024-11-23 MED ORDER — PROPOFOL 10 MG/ML IV BOLUS
INTRAVENOUS | Status: AC
  Filled 2024-11-23: qty 20

## 2024-11-23 MED ORDER — SODIUM CHLORIDE 0.9 % IV SOLN
INTRAVENOUS | Status: AC
  Filled 2024-11-23: qty 10

## 2024-11-23 MED ORDER — CLONIDINE HCL (ANALGESIA) 100 MCG/ML EP SOLN
EPIDURAL | Status: DC | PRN
  Administered 2024-11-23 (×2): 25 ug

## 2024-11-23 MED ORDER — PROPOFOL 10 MG/ML IV BOLUS
INTRAVENOUS | Status: DC | PRN
  Administered 2024-11-23: 140 mg via INTRAVENOUS

## 2024-11-23 MED ORDER — ACETAMINOPHEN 500 MG PO TABS
ORAL_TABLET | ORAL | Status: AC
  Filled 2024-11-23: qty 2

## 2024-11-23 MED ORDER — HEPARIN (PORCINE) IN NACL 2-0.9 UNITS/ML
INTRAMUSCULAR | Status: AC | PRN
  Administered 2024-11-23: 500 mL via INTRAVENOUS

## 2024-11-23 MED ORDER — ONDANSETRON 4 MG PO TBDP: 4.0000 mg | ORAL_TABLET | Freq: Four times a day (QID) | ORAL | Status: DC | PRN

## 2024-11-23 MED ORDER — DROPERIDOL 2.5 MG/ML IJ SOLN
INTRAMUSCULAR | Status: DC | PRN
  Administered 2024-11-23: .625 mg via INTRAVENOUS

## 2024-11-23 MED ORDER — MAGTRACE LYMPHATIC TRACER
INTRAMUSCULAR | Status: DC | PRN
  Administered 2024-11-23: 2 mL via INTRAMUSCULAR

## 2024-11-23 MED ORDER — HEPARIN SOD (PORK) LOCK FLUSH 100 UNIT/ML IV SOLN
INTRAVENOUS | Status: AC
  Filled 2024-11-23: qty 5

## 2024-11-23 MED ORDER — LIDOCAINE HCL (CARDIAC) PF 100 MG/5ML IV SOSY
PREFILLED_SYRINGE | INTRAVENOUS | Status: DC | PRN
  Administered 2024-11-23: 50 mg via INTRAVENOUS

## 2024-11-23 MED ORDER — ACETAMINOPHEN 500 MG PO TABS
1000.0000 mg | ORAL_TABLET | Freq: Once | ORAL | Status: AC
  Administered 2024-11-23: 1000 mg via ORAL

## 2024-11-23 MED ORDER — KETOROLAC TROMETHAMINE 30 MG/ML IJ SOLN
30.0000 mg | Freq: Three times a day (TID) | INTRAMUSCULAR | Status: AC
  Administered 2024-11-23 – 2024-11-24 (×3): 30 mg via INTRAVENOUS
  Filled 2024-11-23 (×3): qty 1

## 2024-11-23 MED ORDER — FENTANYL CITRATE (PF) 100 MCG/2ML IJ SOLN
INTRAMUSCULAR | Status: DC | PRN
  Administered 2024-11-23 (×2): 50 ug via INTRAVENOUS

## 2024-11-23 MED ORDER — SCOPOLAMINE 1 MG/3DAYS TD PT72
MEDICATED_PATCH | TRANSDERMAL | Status: AC
  Filled 2024-11-23: qty 1

## 2024-11-23 MED ORDER — BUPIVACAINE-EPINEPHRINE (PF) 0.25% -1:200000 IJ SOLN
INTRAMUSCULAR | Status: DC | PRN
  Administered 2024-11-23 (×2): 30 mL via PERINEURAL

## 2024-11-23 MED ORDER — MIDAZOLAM HCL 2 MG/2ML IJ SOLN
INTRAMUSCULAR | Status: AC
  Filled 2024-11-23: qty 2

## 2024-11-23 MED ORDER — CEFAZOLIN SODIUM-DEXTROSE 2-4 GM/100ML-% IV SOLN
INTRAVENOUS | Status: AC
  Filled 2024-11-23: qty 100

## 2024-11-23 MED ORDER — SUGAMMADEX SODIUM 200 MG/2ML IV SOLN
INTRAVENOUS | Status: DC | PRN
  Administered 2024-11-23: 120 mg via INTRAVENOUS

## 2024-11-23 MED ORDER — FENTANYL CITRATE (PF) 100 MCG/2ML IJ SOLN: 25.0000 ug | INTRAMUSCULAR | Status: DC | PRN

## 2024-11-23 MED ORDER — AMISULPRIDE (ANTIEMETIC) 5 MG/2ML IV SOLN: 10.0000 mg | Freq: Once | INTRAVENOUS | Status: DC | PRN

## 2024-11-23 MED ORDER — SCOPOLAMINE 1 MG/3DAYS TD PT72
1.0000 | MEDICATED_PATCH | TRANSDERMAL | Status: DC
  Administered 2024-11-23: 1 mg via TRANSDERMAL

## 2024-11-23 MED ORDER — ONDANSETRON HCL 4 MG/2ML IJ SOLN
INTRAMUSCULAR | Status: AC
  Filled 2024-11-23: qty 2

## 2024-11-23 MED ORDER — PROPOFOL 500 MG/50ML IV EMUL
INTRAVENOUS | Status: DC | PRN
  Administered 2024-11-23: 50 ug/kg/min via INTRAVENOUS

## 2024-11-23 MED ORDER — DEXAMETHASONE SODIUM PHOSPHATE 4 MG/ML IJ SOLN
INTRAMUSCULAR | Status: DC | PRN
  Administered 2024-11-23: 5 mg via INTRAVENOUS

## 2024-11-23 MED ORDER — BUPIVACAINE-EPINEPHRINE (PF) 0.25% -1:200000 IJ SOLN
INTRAMUSCULAR | Status: AC
  Filled 2024-11-23: qty 30

## 2024-11-23 MED ORDER — LIDOCAINE 2% (20 MG/ML) 5 ML SYRINGE
INTRAMUSCULAR | Status: AC
  Filled 2024-11-23: qty 5

## 2024-11-23 MED ORDER — ROCURONIUM BROMIDE 100 MG/10ML IV SOLN
INTRAVENOUS | Status: DC | PRN
  Administered 2024-11-23 (×2): 20 mg via INTRAVENOUS
  Administered 2024-11-23: 10 mg via INTRAVENOUS
  Administered 2024-11-23: 50 mg via INTRAVENOUS
  Administered 2024-11-23: 20 mg via INTRAVENOUS

## 2024-11-23 MED ORDER — ALBUMIN HUMAN 5 % IV SOLN: INTRAVENOUS | Status: DC | PRN

## 2024-11-23 MED ORDER — SODIUM CHLORIDE 0.9 % IV SOLN
INTRAVENOUS | Status: DC | PRN
  Administered 2024-11-23: 500 mL

## 2024-11-23 MED ORDER — HEPARIN SOD (PORK) LOCK FLUSH 100 UNIT/ML IV SOLN
INTRAVENOUS | Status: DC | PRN
  Administered 2024-11-23: 500 [IU] via INTRAVENOUS

## 2024-11-23 MED ORDER — ONDANSETRON HCL 4 MG/2ML IJ SOLN
4.0000 mg | Freq: Four times a day (QID) | INTRAMUSCULAR | Status: DC | PRN
  Filled 2024-11-23: qty 2

## 2024-11-23 MED ORDER — LACTATED RINGERS IV SOLN: INTRAVENOUS | Status: DC

## 2024-11-23 MED ORDER — ENOXAPARIN SODIUM 40 MG/0.4ML IJ SOSY
40.0000 mg | PREFILLED_SYRINGE | INTRAMUSCULAR | Status: DC
  Administered 2024-11-24: 40 mg via SUBCUTANEOUS
  Filled 2024-11-23: qty 0.4

## 2024-11-23 MED ORDER — OXYCODONE HCL 5 MG PO TABS
5.0000 mg | ORAL_TABLET | ORAL | Status: DC | PRN
  Administered 2024-11-23: 10 mg via ORAL
  Filled 2024-11-23: qty 2

## 2024-11-23 MED ORDER — CEFAZOLIN SODIUM-DEXTROSE 1-4 GM/50ML-% IV SOLN
1.0000 g | Freq: Three times a day (TID) | INTRAVENOUS | Status: AC
  Administered 2024-11-23 – 2024-11-24 (×3): 1 g via INTRAVENOUS
  Filled 2024-11-23 (×3): qty 50

## 2024-11-23 MED ORDER — PHENYLEPHRINE HCL-NACL 20-0.9 MG/250ML-% IV SOLN
INTRAVENOUS | Status: DC | PRN
  Administered 2024-11-23: 40 ug/min via INTRAVENOUS

## 2024-11-23 MED ORDER — CEFAZOLIN SODIUM-DEXTROSE 2-4 GM/100ML-% IV SOLN
2.0000 g | INTRAVENOUS | Status: AC
  Administered 2024-11-23: 2 g via INTRAVENOUS

## 2024-11-23 MED ORDER — PHENYLEPHRINE HCL (PRESSORS) 10 MG/ML IV SOLN
INTRAVENOUS | Status: DC | PRN
  Administered 2024-11-23 (×11): 160 ug via INTRAVENOUS

## 2024-11-23 MED ORDER — FENTANYL CITRATE (PF) 100 MCG/2ML IJ SOLN
INTRAMUSCULAR | Status: AC
  Filled 2024-11-23: qty 2

## 2024-11-23 MED ORDER — TRANEXAMIC ACID 1000 MG/10ML IV SOLN
INTRAVENOUS | Status: AC
  Filled 2024-11-23: qty 60

## 2024-11-23 MED ORDER — DEXAMETHASONE SOD PHOSPHATE PF 10 MG/ML IJ SOLN
INTRAMUSCULAR | Status: AC
  Filled 2024-11-23: qty 1

## 2024-11-23 MED ORDER — ONDANSETRON HCL 4 MG/2ML IJ SOLN
4.0000 mg | Freq: Once | INTRAMUSCULAR | Status: AC | PRN
  Administered 2024-11-23: 4 mg via INTRAVENOUS

## 2024-11-23 MED ORDER — FENTANYL CITRATE (PF) 100 MCG/2ML IJ SOLN
50.0000 ug | Freq: Once | INTRAMUSCULAR | Status: AC
  Administered 2024-11-23: 50 ug via INTRAVENOUS

## 2024-11-23 MED ORDER — TRANEXAMIC ACID 1000 MG/10ML IV SOLN
Status: DC | PRN
  Administered 2024-11-23: 3000 mg via TOPICAL

## 2024-11-23 MED ORDER — METHOCARBAMOL 500 MG PO TABS
500.0000 mg | ORAL_TABLET | Freq: Four times a day (QID) | ORAL | Status: DC | PRN
  Administered 2024-11-23 – 2024-11-24 (×3): 500 mg via ORAL
  Filled 2024-11-23 (×3): qty 1

## 2024-11-23 MED ORDER — KCL IN DEXTROSE-NACL 20-5-0.45 MEQ/L-%-% IV SOLN: INTRAVENOUS | Status: DC

## 2024-11-23 MED ORDER — MIDAZOLAM HCL (PF) 2 MG/2ML IJ SOLN
2.0000 mg | Freq: Once | INTRAMUSCULAR | Status: AC
  Administered 2024-11-23: 2 mg via INTRAVENOUS

## 2024-11-23 MED ORDER — HEPARIN (PORCINE) IN NACL 1000-0.9 UT/500ML-% IV SOLN
INTRAVENOUS | Status: AC
  Filled 2024-11-23: qty 500

## 2024-11-23 MED ORDER — HYDROMORPHONE HCL 1 MG/ML IJ SOLN
INTRAMUSCULAR | Status: AC
  Filled 2024-11-23: qty 0.5

## 2024-11-23 MED ORDER — HYDROMORPHONE HCL 1 MG/ML IJ SOLN
INTRAMUSCULAR | Status: DC | PRN
  Administered 2024-11-23: .5 mg via INTRAVENOUS

## 2024-11-23 MED ORDER — POVIDONE-IODINE 10 % EX SOLN
CUTANEOUS | Status: DC | PRN
  Administered 2024-11-23: 1 via TOPICAL

## 2024-11-23 MED ORDER — HYDROMORPHONE HCL 1 MG/ML IJ SOLN: 0.5000 mg | INTRAMUSCULAR | Status: DC | PRN

## 2024-11-23 MED ORDER — BUPIVACAINE-EPINEPHRINE (PF) 0.25% -1:200000 IJ SOLN
INTRAMUSCULAR | Status: DC | PRN
  Administered 2024-11-23: 10 mL

## 2024-11-23 SURGICAL SUPPLY — 101 items
ALLOGRAFT PERF 16X20 1.6+/-0.4 (Tissue) IMPLANT
BAG DECANTER FOR FLEXI CONT (MISCELLANEOUS) ×4 IMPLANT
BENZOIN TINCTURE PRP APPL 2/3 (GAUZE/BANDAGES/DRESSINGS) IMPLANT
BINDER BREAST LRG (GAUZE/BANDAGES/DRESSINGS) IMPLANT
BINDER BREAST MEDIUM (GAUZE/BANDAGES/DRESSINGS) IMPLANT
BINDER BREAST XLRG (GAUZE/BANDAGES/DRESSINGS) IMPLANT
BINDER BREAST XXLRG (GAUZE/BANDAGES/DRESSINGS) IMPLANT
BIOPATCH RED 1 DISK 7.0 (GAUZE/BANDAGES/DRESSINGS) IMPLANT
BLADE HEX COATED 2.75 (ELECTRODE) ×4 IMPLANT
BLADE SURG 10 STRL SS (BLADE) ×4 IMPLANT
BLADE SURG 11 STRL SS (BLADE) ×4 IMPLANT
BLADE SURG 15 STRL LF DISP TIS (BLADE) ×4 IMPLANT
BNDG GAUZE DERMACEA FLUFF 4 (GAUZE/BANDAGES/DRESSINGS) IMPLANT
CANISTER SUCT 1200ML W/VALVE (MISCELLANEOUS) ×4 IMPLANT
CHLORAPREP W/TINT 26 (MISCELLANEOUS) ×4 IMPLANT
CLIP APPLIE 11 MED OPEN (CLIP) IMPLANT
CLIP APPLIE 9.375 MED OPEN (MISCELLANEOUS) ×4 IMPLANT
COVER BACK TABLE 60X90IN (DRAPES) ×4 IMPLANT
COVER MAYO STAND STRL (DRAPES) ×4 IMPLANT
COVER PROBE W GEL 5X96 (DRAPES) ×4 IMPLANT
DERMABOND ADVANCED .7 DNX12 (GAUZE/BANDAGES/DRESSINGS) ×8 IMPLANT
DRAIN CHANNEL 15F RND FF W/TCR (WOUND CARE) IMPLANT
DRAIN CHANNEL 19F RND (DRAIN) ×4 IMPLANT
DRAPE C-ARM 42X72 X-RAY (DRAPES) ×4 IMPLANT
DRAPE INCISE IOBAN 66X45 STRL (DRAPES) IMPLANT
DRAPE LAPAROSCOPIC ABDOMINAL (DRAPES) ×4 IMPLANT
DRAPE TOP ARMCOVERS (MISCELLANEOUS) ×4 IMPLANT
DRAPE U-SHAPE 76X120 STRL (DRAPES) ×4 IMPLANT
DRAPE UTILITY XL STRL (DRAPES) ×4 IMPLANT
DRSG TEGADERM 2-3/8X2-3/4 SM (GAUZE/BANDAGES/DRESSINGS) IMPLANT
DRSG TEGADERM 4X10 (GAUZE/BANDAGES/DRESSINGS) ×4 IMPLANT
DRSG TEGADERM 4X4.75 (GAUZE/BANDAGES/DRESSINGS) IMPLANT
ELECT BLADE 6.5 EXT (BLADE) IMPLANT
ELECT COATED BLADE 2.86 ST (ELECTRODE) ×4 IMPLANT
ELECTRODE BLDE 4.0 EZ CLN MEGD (MISCELLANEOUS) ×4 IMPLANT
ELECTRODE REM PT RTRN 9FT ADLT (ELECTROSURGICAL) ×4 IMPLANT
EVACUATOR SILICONE 100CC (DRAIN) ×4 IMPLANT
EXPANDER TISSUE FORTE 300CC (Breast) IMPLANT
GAUZE 4X4 16PLY ~~LOC~~+RFID DBL (SPONGE) ×4 IMPLANT
GAUZE PAD ABD 8X10 STRL (GAUZE/BANDAGES/DRESSINGS) ×8 IMPLANT
GAUZE SPONGE 4X4 12PLY STRL LF (GAUZE/BANDAGES/DRESSINGS) IMPLANT
GLOVE BIO SURGEON STRL SZ 6 (GLOVE) ×4 IMPLANT
GLOVE BIOGEL PI IND STRL 8 (GLOVE) ×4 IMPLANT
GLOVE ECLIPSE 8.0 STRL XLNG CF (GLOVE) ×4 IMPLANT
GOWN STRL REUS W/ TWL LRG LVL3 (GOWN DISPOSABLE) ×8 IMPLANT
GOWN STRL REUS W/ TWL XL LVL3 (GOWN DISPOSABLE) ×4 IMPLANT
KIT CVR 48X5XPRB PLUP LF (MISCELLANEOUS) ×4 IMPLANT
KIT MARKER MARGIN INK (KITS) IMPLANT
KIT PORT POWER 8FR ISP CVUE (Port) IMPLANT
LIGHT WAVEGUIDE WIDE FLAT (MISCELLANEOUS) IMPLANT
MARKER SKIN DUAL TIP RULER LAB (MISCELLANEOUS) IMPLANT
NDL SAFETY ECLIP 18X1.5 (MISCELLANEOUS) ×4 IMPLANT
NEEDLE HYPO 22X1.5 SAFETY MO (MISCELLANEOUS) IMPLANT
NEEDLE HYPO 25X1 1.5 SAFETY (NEEDLE) ×8 IMPLANT
NEEDLE SPNL 22GX3.5 QUINCKE BK (NEEDLE) IMPLANT
PACK BASIN DAY SURGERY FS (CUSTOM PROCEDURE TRAY) ×4 IMPLANT
PENCIL SMOKE EVACUATOR (MISCELLANEOUS) ×4 IMPLANT
PIN SAFETY STERILE (MISCELLANEOUS) ×4 IMPLANT
PUNCH BIOPSY 4MM DISP (MISCELLANEOUS) IMPLANT
SHEATH COOK PEEL AWAY SET 9F (SHEATH) IMPLANT
SHEET MEDIUM DRAPE 40X70 STRL (DRAPES) ×4 IMPLANT
SLEEVE SCD COMPRESS KNEE MED (STOCKING) ×4 IMPLANT
SOLN 0.9% NACL POUR BTL 1000ML (IV SOLUTION) IMPLANT
SPIKE FLUID TRANSFER (MISCELLANEOUS) IMPLANT
SPONGE T-LAP 18X18 ~~LOC~~+RFID (SPONGE) ×8 IMPLANT
SPONGE T-LAP 4X18 ~~LOC~~+RFID (SPONGE) IMPLANT
STAPLER SKIN PROX WIDE 3.9 (STAPLE) ×4 IMPLANT
STRIP CLOSURE SKIN 1/2X4 (GAUZE/BANDAGES/DRESSINGS) IMPLANT
SUT CHROMIC 3 0 SH 27 (SUTURE) IMPLANT
SUT CHROMIC 4 0 RB 1X27 (SUTURE) IMPLANT
SUT ETHILON 2 0 FS 18 (SUTURE) ×4 IMPLANT
SUT MNCRL AB 3-0 PS2 18 (SUTURE) ×4 IMPLANT
SUT MNCRL AB 4-0 PS2 18 (SUTURE) ×4 IMPLANT
SUT MON AB 4-0 PC3 18 (SUTURE) ×4 IMPLANT
SUT PDS AB 2-0 CT2 27 (SUTURE) IMPLANT
SUT PROLENE 2 0 CT2 30 (SUTURE) IMPLANT
SUT PROLENE 2 0 SH DA (SUTURE) ×4 IMPLANT
SUT SILK 2 0 PERMA HAND 18 BK (SUTURE) ×4 IMPLANT
SUT SILK 2 0 SH (SUTURE) IMPLANT
SUT SILK 2 0 TIES 17X18 (SUTURE) IMPLANT
SUT STRATAFIX SPIRAL PDS+ 0 30 (SUTURE) IMPLANT
SUT VIC AB 0 CT2 27 (SUTURE) IMPLANT
SUT VIC AB 3-0 54X BRD REEL (SUTURE) ×4 IMPLANT
SUT VIC AB 3-0 SH 27X BRD (SUTURE) IMPLANT
SUT VIC AB 4-0 PS2 18 (SUTURE) IMPLANT
SUT VICRYL 3-0 CR8 SH (SUTURE) ×8 IMPLANT
SUT VICRYL RAPIDE 4/0 PS 2 (SUTURE) IMPLANT
SUTURE STRATFX 0 PDS 27 VIOLET (SUTURE) IMPLANT
SYR 10ML LL (SYRINGE) ×4 IMPLANT
SYR 50ML LL SCALE MARK (SYRINGE) ×4 IMPLANT
SYR 5ML LUER SLIP (SYRINGE) ×4 IMPLANT
SYR BULB IRRIG 60ML STRL (SYRINGE) ×4 IMPLANT
SYR CONTROL 10ML LL (SYRINGE) ×8 IMPLANT
TAPE MEASURE VINYL STERILE (MISCELLANEOUS) IMPLANT
TOWEL GREEN STERILE FF (TOWEL DISPOSABLE) ×8 IMPLANT
TRACER MAGTRACE VIAL (MISCELLANEOUS) IMPLANT
TRAY DSU PREP LF (CUSTOM PROCEDURE TRAY) IMPLANT
TRAY FOLEY W/BAG SLVR 14FR (SET/KITS/TRAYS/PACK) IMPLANT
TUBE CONNECTING 20X1/4 (TUBING) ×4 IMPLANT
UNDERPAD 30X36 HEAVY ABSORB (UNDERPADS AND DIAPERS) ×8 IMPLANT
YANKAUER SUCT BULB TIP NO VENT (SUCTIONS) ×4 IMPLANT

## 2024-11-23 NOTE — Anesthesia Procedure Notes (Signed)
 Anesthesia Regional Block: Pectoralis block   Pre-Anesthetic Checklist: , timeout performed,  Correct Patient, Correct Site, Correct Laterality,  Correct Procedure, Correct Position, site marked,  Risks and benefits discussed,  Surgical consent,  Pre-op evaluation,  At surgeon's request and post-op pain management  Laterality: Right  Prep: chloraprep       Needles:  Injection technique: Single-shot  Needle Type: Echogenic Needle     Needle Length: 9cm  Needle Gauge: 21     Additional Needles:   Procedures:,,,, ultrasound used (permanent image in chart),,    Narrative:  Start time: 11/23/2024 6:57 AM End time: 11/23/2024 7:02 AM Injection made incrementally with aspirations every 5 mL.  Performed by: Personally  Anesthesiologist: Corinne Garnette BRAVO, MD  Additional Notes: No pain on injection. No increased resistance to injection. Injection made in 5cc increments.  Good needle visualization.  Patient tolerated procedure well.

## 2024-11-23 NOTE — Progress Notes (Signed)
Assisted Dr. Turk with left, right, pectoralis, ultrasound guided block. Side rails up, monitors on throughout procedure. See vital signs in flow sheet. Tolerated Procedure well. 

## 2024-11-23 NOTE — Anesthesia Procedure Notes (Signed)
 Procedure Name: Intubation Date/Time: 11/23/2024 7:44 AM  Performed by: Burnard Rosaline HERO, CRNAPre-anesthesia Checklist: Patient identified, Emergency Drugs available, Suction available and Patient being monitored Patient Re-evaluated:Patient Re-evaluated prior to induction Oxygen Delivery Method: Circle system utilized Preoxygenation: Pre-oxygenation with 100% oxygen Induction Type: IV induction Ventilation: Mask ventilation without difficulty Laryngoscope Size: Mac and 4 Grade View: Grade I Tube type: Oral Tube size: 7.0 mm Number of attempts: 1 Airway Equipment and Method: Stylet and Oral airway Placement Confirmation: ETT inserted through vocal cords under direct vision, positive ETCO2, breath sounds checked- equal and bilateral and CO2 detector Secured at: 22 cm Tube secured with: Tape Dental Injury: Teeth and Oropharynx as per pre-operative assessment

## 2024-11-23 NOTE — Interval H&P Note (Signed)
 History and Physical Interval Note:  11/23/2024 6:55 AM  Sandra Burton  has presented today for surgery, with the diagnosis of LEFT BREAST CANCER.  The various methods of treatment have been discussed with the patient and family. After consideration of risks, benefits and other options for treatment, the patient has consented to  Procedures with comments: MASTECTOMY, NIPPLE SPARING (Bilateral) BIOPSY, LYMPH NODE, SENTINEL (Left) - GEN w/PEC BLOCK INSERTION, TUNNELED CENTRAL VENOUS DEVICE, WITH PORT (N/A) RECONSTRUCTION, BREAST (Bilateral) - *BILATERAL BREAST RECONSTRUCTION WITH TISSUE EXPANDERS ALLODERM * as a surgical intervention.  The patient's history has been reviewed, patient examined, no change in status, stable for surgery.  I have reviewed the patient's chart and labs.  Questions were answered to the patient's satisfaction.     Sandra Burton

## 2024-11-23 NOTE — Op Note (Addendum)
 Preoperative diagnosis: Stage I left breast cancer upper outer quadrant ER negative  History of mantle radiation for lymphoma  Postoperative diagnosis: Same  Procedure: Left nipple sparing simple mastectomy with left axillary sentinel lymph node mapping with mag trace, right risk-reducing nipple sparing mastectomy, placement of right 8 French internal jugular Port-A-Cath with C arm and ultrasound guidance  Surgeon: Debby Shipper, MD   Assistant: Dr Arelia MD   Anesthesia: General With bilateral pectoral blocks  Drains: Please see plastic surgery note  Specimen: Bilateral breast to pathology, bilateral nipple biopsies negative by frozen section, 2 left level 1 deep axillary sentinel nodes grossly normal  Indications for procedure: The patient is a 34 year old female with a history of mantle radiation for lymphoma as a teenager who noticed about 3 months ago a knot in her left breast.  Workup showed a 1.8 cm mass.  Core biopsy showed triple negative left breast cancer high grade.  MRI was obtained which showed no evidence of any other breast abnormality or evidence of lymphadenopathy.  She was seen at the multidisciplinary clinic in medical and radiation oncology also evaluated her.  She opted for bilateral mastectomies due to her history of mantle related radiation and high risk of developing new breast cancer with that history.  She has seen plastic surgery and is opted for implant-based reconstruction.  The surgical and non surgical options have been discussed with the patient.  Risks of surgery include bleeding,  Infection,  Flap necrosis,  Tissue loss,  Chronic pain, death, Numbness,  And the need for additional procedures.  Reconstruction options also have been discussed with the patient as well.  The patient agrees to proceed.    Description of procedure: The patient was met in the holding area questions were answered.  She underwent bilateral pectoral blocks per anesthesia protocol.   All questions were answered.  She was then taken back to the operative room placed upon upon the operative table.  After induction of general anesthesia, the right neck and right upper chest regions were prepped in a sterile fashion.  A timeout was performed.  The Port-A-Cath was placed first.  With the patient in Trendelenburg ultrasound was used to visualize the right internal jugular vein.  A needle was placed and a wire was advanced under ultrasound guidance into the superior vena cava.  Imaging with the C arm showed the wire coursing down the internal jugular into the vena cava.  A stab incision was made at the wire insertion site.  Incision was made a 2 cm below the right clavicle and a small pocket was made for the hub of the port.  The port was assembled and flushed.  It was tunneled from the lower incision to the wire insertion site.  It was cut to 21 cm.  With the patient in Trendelenburg the introducer and dilator were passed over the wire removed the wire to and fro with no resistance.  Once the introducer was then placed the wire and dilator were removed.  The catheter was placed in the reducer and the peel-away sheath was peeled away.  Imaging showed the tip to be in the mid SVC.  It drew back dark nonpulsatile blood and flushed easily.  C-arm images were obtained to show the tip in the mid SVC, no evidence of hemo or pneumothorax noted.  The catheter is ready for use.  The hub was secured to the chest wall with 2-0 Prolene.  The incisions were closed with 3-0 Vicryl and 4  Monocryl.  The patient was repositioned and reprepped and draped.  Both breasts were prepped and draped in a sterile fashion and a second timeout performed.  2 cc of mag tracer injected into the left breast in the upper outer quadrant.  The right mastectomy is performed first.  A curvilinear incision was made along the inframammary fold.  Using combination of cautery and sharp dissection we dissected down to the pectoralis major  muscle.  We then dissected the posterior aspect the breast off the pectoralis muscle taking the fascia with it.  This was done up to the clavicle, medially to the sternum and then laterally to the lateral attachments.  We then created a subcutaneous dissection under the skin.  This was done with sharp dissection as well as cautery.  This was taken up under the nipple areolar complex.  A biopsy of this was taken and sent to pathology which by frozen section was negative.  We continued the dissection superiorly to the clavicle.  Once this peer attachments were identified we used cautery to divide the breast.  Lateral attachments were divided in a similar fashion as were the medial attachments.  The entire breast was delivered through the wound.  A single stitch of 2-0 silk was placed to mark the superior border.  Hemostasis achieved with cautery and TXA soaked sponge.  The MAC trace probe was used to identify area of increased signal in the left axilla.  A 4 cm incision was made in the left insula.  Dissection was carried out into the level 1 contents.  There were 2 nodes that were discolored from the tracer.  Signals were modest but they were spike compared to the baseline lymph node basin.  These were removed as sentinel nodes.  The probe was replaced and there were no other significant spikes to indicate any further sentinel lymph nodes.  These were grossly normal.  Palpation of lymph nodes of level 1 level 2 were all normal.  The long thoracic nerve thoracodorsal trunk and extra vein were all preserved.  This was then closed with a deep layer of 3-0 Vicryl for the subcutaneous tissues.  4-0 Monocryl used to close the skin in a subcuticular fashion.  A similar curvilinear incision was made along the inframammary fold in a similar fashion to the right side.  Dissection was carried down to the pectoralis fashion and the breast was dissected off this posteriorly.  The subcutaneous dissection was then performed a  similar fashion with sharp dissection as well as cautery.  Once the nipple areolar complex was identified a biopsy of this was taken posteriorly.  This was benign by frozen section.  The remainder of the dissection was subcutaneous to mobilize the breast away from the skin.  The tumor was larger than previous examination.  It appeared to be now adherent to the skin at the biopsy site.  An ellipse of skin was taken over the tumor to represent the superficial skin margin.  A plane was developed around the tumor using sharp dissection with scissors to free the tumor.  This was the only area where was adherent to the skin and this was reduced back under the skin with tumor.  We were able to cauterize lateral to this and superior to mobilize the breast tissue further and then divide the superior attachments from the breast to mobilize this down into the incision better.  We continued medially as well as similar fashion with cautery to mobilize the medial and superior attachments.  The breast was delivered through the incision.  I took an additional superior margin from where the tumor was and sent this separately as a oriented with ink.  The entire mastectomy specimen was oriented with ink and sent to pathology.  Inspection for hemostasis on both sides was excellent.  There is no ongoing bleeding.  At this point the case was turned over to plastic surgery for completion of the reconstruction.  Please see the note regarding that.  EBL at this point was 100 cc.  All counts were correct.

## 2024-11-23 NOTE — Anesthesia Postprocedure Evaluation (Signed)
"   Anesthesia Post Note  Patient: Production Assistant, Radio  Procedure(s) Performed: BILATERAL NIPPLE SPARING MASTECTOMIES (Bilateral: Breast) LEFT SENTINEL LYMPH NODE MAPPING (Left: Axilla) INSERTION TUNNELED CENTRAL VENOUS DEVICE WITH PORT (Right: Chest) BREAST RECONSTRUCTION WITH TISSUE EXPANDERS AND ACELLULAR DERMIS (Bilateral: Chest)     Patient location during evaluation: PACU Anesthesia Type: General Level of consciousness: awake and alert Pain management: pain level controlled Vital Signs Assessment: post-procedure vital signs reviewed and stable Respiratory status: spontaneous breathing, nonlabored ventilation and respiratory function stable Cardiovascular status: blood pressure returned to baseline and stable Postop Assessment: no apparent nausea or vomiting Anesthetic complications: no   No notable events documented.  Last Vitals:  Vitals:   11/23/24 1415 11/23/24 1445  BP: (!) 98/59 93/68  Pulse: 98 86  Resp: 16 16  Temp:  36.8 C  SpO2: 99% 100%    Last Pain:  Vitals:   11/23/24 1445  TempSrc:   PainSc: 3                  Garnette FORBES Skillern      "

## 2024-11-23 NOTE — Op Note (Signed)
 Operative Note   DATE OF OPERATION: 1.8.2026  LOCATION: [facility]-outpatient  SURGICAL DIVISION: Plastic Surgery  PREOPERATIVE DIAGNOSES:  1. Left breast cancer [facility] ER- 2. History therapeutic radiation  POSTOPERATIVE DIAGNOSES:  same  PROCEDURE:  1. Bilateral breast reconstruction with tissue expanders 2. Acellular dermis (Alloderm) to bilateral chest 600 cm2  SURGEON: Earlis Ranks MD MBA  ASSISTANT: none  ANESTHESIA:  General.   EBL: 325 ml for entire procedure  COMPLICATIONS: None immediate.   INDICATIONS FOR PROCEDURE:  The patient, Sandra Burton, is a 34 y.o. female born on 1991-05-16, is here for bilateral prepectoral tissue expander based reconstruction following nipple sparing mastectomies.   FINDINGS: Natrelle 133S FV-11-T 300 ml tissue expanders placed bilateral. Initial fill volume 150 ml air bilateral. RIGHT SN [id number] LEFT SN [id number]  DESCRIPTION OF PROCEDURE:  The patient was marked with the patient in the preoperative area to mark sternal notch, chest midline, anterior axillary lines and inframammary folds. Following induction, Foley catheter placed. The patient's operative site was prepped and draped in a sterile fashion. A time out was performed and all information was confirmed to be correct. I assisted in mastectomies with retraction and exposure. Following completion of mastectomies, reconstruction began on the right side. Hemostasis ensured. Cavity irrigated with saline followed by saline solution containing Ancef , gentamicin , and Betadine . A 19 Fr drain was placed in subcutaneous position laterally and a 15 Fr drain placed along inframammary fold. Each secured to skin with 2-0 nylon. Laterally the mastectomy flap over posterior axillary line was advanced anteriorly and the subcutaneous tissue and superficial fascia was secured to chest wall with 0-vicryl. The tissue expanders were prepared on back table prior in insertion. The expander was filled with  air. Perforated acellular dermis was draped over anterior surface expander. The ADM was then secured to itself over posterior surface of expander with 4-0 chromic. Redundant folds acellular dermis excised so that the ADM lay flat without folds over air filled expander. The expander was secured to fascia over lateral sternal border with a 0 vicryl. The lateral tab was also secured to pectoralis muscle with 0-vicryl. The ADM was secured to pectoralis muscle and chest wall along inferior border at inframammary fold with 0 Stattafix suture. Skin closure completed with 3-0 vicryl in fascial layer and 4-0 vicryl in dermis. Skin closure completed with 4-0 monocryl subcuticular and tissue adhesive.   I then directed my attention to left chest where similar irrigation and drain placement completed. The planned incision over palpable tumor over left upper outer quadrant was closed primarily with interrupted 3-0 vicryl in dermis and running simple 4-0 monocryl skin closure. The prepared expander with ADM secured over anterior surface was placed in left chest and tabs secured to chest wall and pectoralis muscle with 0- vicryl suture. The acellular dermis at inframammary fold was secured to chest wall with 0 Stattafix  suture. Laterally the mastectomy flap over posterior axillary line was advanced anteriorly and the subcutaneous tissue and superficial fascia was secured to chest wall with 0-vicryl. Skin closure completed with 3-0 vicryl in fascial layer and 4-0 vicryl in dermis. Skin closure completed with 4-0 monocryl subcuticular. The mastectomy flaps were redraped so that NAC was symmetric from sternal notch and chest midline. Tissue adhesive applied over all incisions. Tegaderm dressings applied followed by dry dressing, breast binder.   The patient was allowed to wake from anesthesia, extubated and taken to the recovery room in satisfactory condition.   SPECIMENS: none  DRAINS: 15 and 19  Fr JP in right and left  subcutaneous chest  Earlis Ranks, MD [facility] Plastic & Reconstructive Surgery  Office/ physician access line after hours [phone]

## 2024-11-23 NOTE — Progress Notes (Signed)
 Patient ambulated in hallway with minimal assistance and tolerable pain. Family  members at bedside. Husband and mother were demonstrated and educated on jp drains. Husband was able to demonstrate understanding and feels comfortable doing this at home. Setzer, Isaiah Jansky

## 2024-11-23 NOTE — Transfer of Care (Signed)
 Immediate Anesthesia Transfer of Care Note  Patient: Production Assistant, Radio  Procedure(s) Performed: BILATERAL NIPPLE SPARING MASTECTOMIES (Bilateral: Breast) LEFT SENTINEL LYMPH NODE MAPPING (Left: Axilla) INSERTION TUNNELED CENTRAL VENOUS DEVICE WITH PORT (Right: Chest) BREAST RECONSTRUCTION WITH TISSUE EXPANDERS AND ACELLULAR DERMIS (Bilateral: Chest)  Patient Location: PACU  Anesthesia Type:General and Regional  Level of Consciousness: awake, drowsy, and patient cooperative  Airway & Oxygen Therapy: Patient Spontanous Breathing and Patient connected to face mask oxygen  Post-op Assessment: Report given to RN and Post -op Vital signs reviewed and stable  Post vital signs: Reviewed and stable  Last Vitals:  Vitals Value Taken Time  BP 95/56 11/23/24 13:33  Temp    Pulse 93 11/23/24 13:35  Resp 17 11/23/24 13:35  SpO2 100 % 11/23/24 13:35  Vitals shown include unfiled device data.  Last Pain:  Vitals:   11/23/24 0639  TempSrc: Oral  PainSc: 0-No pain         Complications: No notable events documented.

## 2024-11-23 NOTE — Anesthesia Procedure Notes (Signed)
 Anesthesia Regional Block: Pectoralis block   Pre-Anesthetic Checklist: , timeout performed,  Correct Patient, Correct Site, Correct Laterality,  Correct Procedure, Correct Position, site marked,  Risks and benefits discussed,  Surgical consent,  Pre-op evaluation,  At surgeon's request and post-op pain management  Laterality: Left  Prep: chloraprep       Needles:  Injection technique: Single-shot  Needle Type: Echogenic Needle     Needle Length: 9cm  Needle Gauge: 21     Additional Needles:   Procedures:,,,, ultrasound used (permanent image in chart),,    Narrative:  Start time: 11/23/2024 7:02 AM End time: 11/23/2024 7:07 AM Injection made incrementally with aspirations every 5 mL.  Performed by: Personally  Anesthesiologist: Corinne Garnette BRAVO, MD  Additional Notes: No pain on injection. No increased resistance to injection. Injection made in 5cc increments.  Good needle visualization.  Patient tolerated procedure well.

## 2024-11-23 NOTE — Interval H&P Note (Signed)
 History and Physical Interval Note:  11/23/2024 7:16 AM  Sandra Burton  has presented today for surgery, with the diagnosis of LEFT BREAST CANCER.  The various methods of treatment have been discussed with the patient and family. After consideration of risks, benefits and other options for treatment, the patient has consented to  Procedures with comments: MASTECTOMY, NIPPLE SPARING (Bilateral) BIOPSY, LYMPH NODE, SENTINEL (Left) - GEN w/PEC BLOCK INSERTION, TUNNELED CENTRAL VENOUS DEVICE, WITH PORT (N/A) RECONSTRUCTION, BREAST (Bilateral) - *BILATERAL BREAST RECONSTRUCTION WITH TISSUE EXPANDERS ALLODERM * as a surgical intervention.  The patient's history has been reviewed, patient examined, no change in status, stable for surgery.  I have reviewed the patient's chart and labs.  Questions were answered to the patient's satisfaction.    The surgical and non surgical options have been discussed with the patient.  Risks of surgery include bleeding,  Infection,  Flap necrosis,  Tissue loss,  Chronic pain, death, Numbness, collapse lung, bleeding , catheter failue, mediastinal injury,  And the need for additional procedures.  Reconstruction options also have been discussed with the patient as well.  The patient agrees to proceed.  Alainah Phang A Oracio Galen

## 2024-11-24 ENCOUNTER — Encounter (HOSPITAL_BASED_OUTPATIENT_CLINIC_OR_DEPARTMENT_OTHER): Payer: Self-pay | Admitting: Surgery

## 2024-11-24 DIAGNOSIS — C50412 Malignant neoplasm of upper-outer quadrant of left female breast: Secondary | ICD-10-CM | POA: Diagnosis not present

## 2024-11-24 MED ORDER — MENTHOL 3 MG MT LOZG
1.0000 | LOZENGE | OROMUCOSAL | Status: DC | PRN
  Administered 2024-11-24: 3 mg via ORAL
  Filled 2024-11-24: qty 9

## 2024-11-24 NOTE — Discharge Summary (Signed)
 Physician Discharge Summary  Patient ID: Sandra Burton MRN: [id number] DOB/AGE: 35-Oct-1992 35 y.o.  Admit date: 11/23/2024 Discharge date: 11/24/2024  Admission Diagnoses:  Discharge Diagnoses:  Principal Problem:   Breast cancer, left breast [facility])   Discharged Condition: stable  Hospital Course: Post operatively patient tolerated diet, was ambulatory with minimal assist and pain controlled. Patient had hypotension that was asymptomatic. Instructed on bathing and drain care.  Treatments: surgery: bilateral nipple sparing mastectomies, left sentinel node, port placement, bilateral tissue expander acellular dermis reconstruction 1.8.2026  Discharge Exam: Blood pressure (!) 84/46, pulse 83, temperature 98.5 F (36.9 C), resp. rate 16, height 5' 7 (1.702 m), weight 61.1 kg, last menstrual period 11/08/2024, SpO2 98%, unknown if currently breastfeeding. Incision/Wound: chest soft bilateral, incisions dry intact, Tegaderms in place, drains serosanguinous  Disposition: Discharge disposition: 01-Home or Self Care       Discharge Instructions     Call MD for:  redness, tenderness, or signs of infection (pain, swelling, bleeding, redness, odor or green/yellow discharge around incision site)   Complete by: As directed    Call MD for:  temperature >100.5   Complete by: As directed    Discharge instructions   Complete by: As directed    Ok to remove dressings and shower am 1.10.2026. Soap and water ok, pat Tegaderms dry. Do not remove Tegaderms. No creams or ointments over incisions. Do not let drains dangle in shower, attach to lanyard or similar.Strip and record drains twice daily and bring log to clinic visit.  Breast binder or soft compression bra all other times.  Ok to raise arms above shoulders for bathing and dressing.  No house yard work or exercise until cleared by MD.   Patient received all Rx preop. Recommend ibuprofen  with meals to aid with pain control. Also ok to use  Tylenol  for pain. Recommend Miralax or Dulcolax as needed for constipation. Ice packs to chest for comfort.   Driving Restrictions   Complete by: As directed    No driving for 2 weeks then no driving if taking prescription pain medication   Lifting restrictions   Complete by: As directed    No lifting > 5-10 lbs until cleared by MD   Resume previous diet   Complete by: As directed       Allergies as of 11/24/2024       Reactions   Sulfamethoxazole    History of sulfa allergy as a child        Medication List    You have not been prescribed any medications.     Follow-up Information     Arelia Filippo, MD Follow up in 1 week(s).   Specialty: Plastic Surgery Why: as scheduled Contact information: [address] [place] [address] [phone]         Vanderbilt Ned, MD Follow up.   Specialty: General Surgery Why: as scheduled Contact information: [address] [place] [address] [phone]                 Signed: Filippo Arelia 11/24/2024, 7:10 AM

## 2024-11-27 LAB — SURGICAL PATHOLOGY

## 2024-11-29 ENCOUNTER — Encounter: Payer: Self-pay | Admitting: *Deleted

## 2024-11-30 ENCOUNTER — Ambulatory Visit: Payer: Self-pay | Admitting: Surgery

## 2024-12-12 ENCOUNTER — Inpatient Hospital Stay: Attending: Hematology and Oncology | Admitting: Hematology and Oncology

## 2024-12-12 VITALS — BP 102/74 | HR 120 | Temp 98.2°F | Resp 17 | Ht 67.0 in | Wt 136.9 lb

## 2024-12-12 DIAGNOSIS — Z171 Estrogen receptor negative status [ER-]: Secondary | ICD-10-CM | POA: Insufficient documentation

## 2024-12-12 DIAGNOSIS — C50412 Malignant neoplasm of upper-outer quadrant of left female breast: Secondary | ICD-10-CM | POA: Diagnosis not present

## 2024-12-12 DIAGNOSIS — Z808 Family history of malignant neoplasm of other organs or systems: Secondary | ICD-10-CM | POA: Insufficient documentation

## 2024-12-12 DIAGNOSIS — Z923 Personal history of irradiation: Secondary | ICD-10-CM | POA: Insufficient documentation

## 2024-12-12 DIAGNOSIS — Z8572 Personal history of non-Hodgkin lymphomas: Secondary | ICD-10-CM | POA: Insufficient documentation

## 2024-12-12 DIAGNOSIS — Z800 Family history of malignant neoplasm of digestive organs: Secondary | ICD-10-CM | POA: Insufficient documentation

## 2024-12-12 DIAGNOSIS — Z9221 Personal history of antineoplastic chemotherapy: Secondary | ICD-10-CM | POA: Insufficient documentation

## 2024-12-12 DIAGNOSIS — Z9013 Acquired absence of bilateral breasts and nipples: Secondary | ICD-10-CM | POA: Insufficient documentation

## 2024-12-12 NOTE — Progress Notes (Unsigned)
 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 Upstaged from stage II to IIIC due to tumor size and lymph node positivity. Anthracycline-based chemotherapy contraindicated. Adjuvant chemotherapy indicated due to recurrence risk. Radiation therapy required post-chemotherapy. No local clinical trials available. - Restaged to pathologic stage IIIC. - Ordered systemic staging scans (CT and bone scan) to rule out distant metastases per stage III protocol. - We have coordinated with [facility] pharmacy team, she received 142 mg/m2 of anthracycline so far, so we should have room to proceed with anthracycline again Although KEYNOTE 522 is a neoadj trial, given young age, high risk tumor, and questionable benefit from immunotherapy, I would like to give her the benefit of doubt and proceed with KEYNOTE 522 regimen if approved If not, we will proceed with AC followed by carbotaxol. She doesn't desire fertility preservation nor zoladex. Discussed with Dr Arelia, she recommends about another week or so and return to clinic as scheduled for clearance I called pt and her husband, discussed the plan I talked to Cardiooncology  at [facility], requested they accommodate her urgently, this will be taken care of according Mr Chyrl Hora nursing manager there. She should proceed with staging, get cardio oncology follow up, Dr Arelia to follow up and we anticipate starting the chemotherapy in the next 2 weeks.   HISTORY OF PRESENTING ILLNESS:  Sandra Burton 34 y.o. female is here because of new diagnosis of breast cancer  Discussed the use of AI scribe software for clinical note transcription with the patient, who gave verbal consent to proceed.  History of Present Illness Sandra Burton is a 34 year old female with a history of Hodgkin's lymphoma who presents with a new diagnosis of triple negative breast cancer.  She first noticed a lump in her breast approximately two months ago, which she described as swelling and then reducing in size. There has been no significant growth of the lump since it was first noticed.  Her past medical history includes Hodgkin's lymphoma, for which she was treated at the age of 30 with anthracyclines, receiving a total dose of 142 mg/m.   In terms of family history, her mother had thyroid cancer at a young age, and there is a history of pancreatic cancer on her mother's side.  She has one child, a three-year-old daughter, and is uncertain about having more children in the future. She has not undergone fertility preservation and is considering her options regarding future fertility.  She works in presenter, broadcasting.   Sandra Burton is a 34 year old female with ER-negative, grade 3 invasive carcinoma of the left breast who presents for oncology follow-up to review pathology results and discuss adjuvant therapy options.  She recently underwent bilateral nipple-sparing mastectomy for left-sided breast cancer. Final pathology revealed a 5 cm grade 3 invasive carcinoma in the left breast, larger than previously estimated,  with focal involvement of the anterior inked margin and superior resected edge, but  ultimately negative final margins. 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 Two sentinel lymph nodes were removed; one contained a 1.2 mm focus of tumor with the capsule intact, and the other was negative. The right breast is free of disease.  She previously received anthracycline-based chemotherapy (Adriamycin) , received a dose of 142 mg/m2 for adriamycin. She has not received further chemotherapy since surgery and currently has a port in place for future treatments. She is undergoing breast reconstruction with tissue expanders, with weekly follow-up by plastic surgery and a planned saline expansion later this week.  During the visit, she expressed feeling overwhelmed by the larger tumor size and the change in her treatment plan, stating she feels a lot worse than what we originally thought. She has not experienced any new symptoms since surgery and denies chest pain, shortness of breath, abnormal bleeding, bruising, breast pain, mass, or discharge. She is not currently using hormonal contraception and relies on barrier methods.  Past Medical History:  Dx: nodular sclerosing Hodgkins lymphoma, stage IIA Date of dx: 01/13/08 Presented at 16 years with a left sided cervical/supraclavicular swelling. It did not respond to antibiotics so she underewent a lymph node biopsy on 01/13/08 by Dr. Norleen Notice (ENT, [facility]). Path showed nodular sclerosing Hodgkins lymphoma. Work up revealed no distant mets. No fever, night sweats, weight loss. Final stage = IIA. Plan is to treat with 4 courses COPP/ABV and then 21Gy Involved field radiation therapy if in complete remission after 4 courses. She was not enrolled on a COG clinical trial because she was not eligible for any open trials at the time of diagnosis (the low risk study had recently closed).Completed 4 courses chemotherapy on 05/21/08. Completed involved field radiation on 06/19/08. End rx scans on 07/30/08 show  NED.   CUM chemotherapy: Doxorubicin 142 mg/m2; Cyclophosphamide 2391 mg/m2; procarbazine 2489 mg/m2; bleomycin 40 U/m2 Other chemo: vincristine; vinblastine ; prednsione RT: 21 Gy L neck and mediastinum No blood products    All other systems were reviewed with the patient and are negative.  MEDICAL HISTORY:  Past Medical History:  Diagnosis Date   Asthma    Family history of pancreatic cancer    Family history of prostate cancer    Family history of thyroid cancer    Hodgkin lymphoma (HCC)    chemo, radiation. completed 8/09. treated @ [facility] stage II   Mitral valve prolapse    Personal history of radiation therapy 2009   Hodgkin's Lymphoma    SURGICAL HISTORY: Past Surgical History:  Procedure Laterality Date   BREAST BIOPSY Left 10/02/2024   US  LT BREAST BX W LOC DEV 1ST LESION IMG BX SPEC US  GUIDE 10/02/2024 [facility] MAMMOGRAPHY   BREAST RECONSTRUCTION Bilateral 11/23/2024   Procedure: BREAST RECONSTRUCTION WITH TISSUE EXPANDERS AND ACELLULAR DERMIS;  Surgeon: Arelia Filippo, MD;  Location: [facility];  Service: Plastics;  Laterality: Bilateral;  *BILATERAL BREAST RECONSTRUCTION WITH TISSUE EXPANDERS ALLODERM *   DEEP NECK LYMPH NODE BIOPSY / EXCISION     KNEE SURGERY     acl repair X 3   NIPPLE SPARING MASTECTOMY Bilateral 11/23/2024   Procedure: BILATERAL NIPPLE SPARING MASTECTOMIES;  Surgeon: Vanderbilt Ned, MD;  Location: [facility];  Service: General;  Laterality: Bilateral;   PORTACATH PLACEMENT Right 11/23/2024   Procedure: INSERTION TUNNELED CENTRAL VENOUS DEVICE WITH PORT;  Surgeon: Vanderbilt Ned, MD;  Location: [facility];  Service: General;  Laterality: Right;   SENTINEL NODE BIOPSY Left 11/23/2024   Procedure: LEFT SENTINEL LYMPH NODE MAPPING;  Surgeon: Vanderbilt Ned, MD;  Location: [facility];  Service: General;  Laterality: Left;  GEN w/PEC BLOCK    SOCIAL HISTORY: Social History   Socioeconomic  History   Marital status: Married    Spouse name: Not on file   Number of children: Not on file   Years of education: Not on file   Highest education level: Not on file  Occupational History   Not on file  Tobacco Use   Smoking status: Never   Smokeless tobacco: Never  Vaping Use   Vaping status: Never Used  Substance and Sexual Activity   Alcohol use: Yes   Drug use: Never   Sexual activity: Yes    Partners: Male    Birth control/protection: Condom  Other Topics Concern   Not on file  Social History Narrative   Not on file   Social Drivers of Health   Tobacco Use: Low Risk (12/07/2024)   Received from [facility]   Patient History    Smoking Tobacco Use: Never    Smokeless Tobacco Use: Never    Passive Exposure: Never  Financial Resource Strain: Not on file  Food Insecurity: No Food Insecurity (10/07/2024)   Epic    Worried About Programme Researcher, Broadcasting/film/video in the Last Year: Never true    Ran Out of Food in the Last Year: Never true  Transportation Needs: No Transportation Needs (10/07/2024)   Epic    Lack of Transportation (Medical): No    Lack of Transportation (Non-Medical): No  Physical Activity: Not on file  Stress: Not on file  Social Connections: Not on file  Intimate Partner Violence: Not on file  Depression (PHQ2-9): Low Risk (12/12/2024)   Depression (PHQ2-9)    PHQ-2 Score: 0  Alcohol Screen: Not on file  Housing: Unknown (10/11/2024)   Received from [facility]   Epic    Unable to Pay for Housing in the Last Year: Not on file    Number of Times Moved in the Last Year: Not on file    At any time in the past 12 months, were you homeless or living in a shelter (including now)?: No  Utilities: Not At Risk (10/07/2024)   Epic    Threatened with loss of utilities: No  Health Literacy: Not on file    FAMILY HISTORY: Family History  Problem Relation Age of Onset   Thyroid disease Mother 15       thyroidectomy   Diabetes Father     Diabetes Paternal Grandmother    Diabetes Paternal Aunt    BRCA 1/2 Neg Hx    Breast cancer Neg Hx     ALLERGIES:  is allergic to sulfamethoxazole.  MEDICATIONS:  No current outpatient medications on file.   No current facility-administered medications for this visit.     PHYSICAL EXAMINATION: ECOG PERFORMANCE STATUS: 0 - Asymptomatic  Vitals:   12/12/24 1235  BP: 102/74  Pulse: (!) 120  Resp: 17  Temp: 98.2 F (36.8 C)  SpO2: 96%   Filed Weights   12/12/24 1235  Weight: 136 lb 14.4 oz (62.1 kg)    GENERAL:alert, no distress and comfortable SKIN: skin color, texture, turgor are normal, no rashes or significant lesions EYES: normal, conjunctiva are pink and non-injected, sclera clear OROPHARYNX:no exudate, no erythema and lips, buccal mucosa,  and tongue normal  NECK: supple, thyroid normal size, non-tender, without nodularity LYMPH:  no palpable lymphadenopathy in the cervical, axillary  LUNGS: clear to auscultation and percussion with normal breathing effort HEART: regular rate & rhythm and no murmurs and no lower extremity edema ABDOMEN:abdomen soft, non-tender and normal bowel sounds Musculoskeletal:no cyanosis of digits and no clubbing  PSYCH: alert & oriented x 3 with fluent speech NEURO: no focal motor/sensory deficits  LABORATORY DATA:  I have reviewed the data as listed Lab Results  Component Value Date   WBC 7.0 10/11/2024   HGB 12.8 10/11/2024   HCT 38.5 10/11/2024   MCV 93.9 10/11/2024   PLT 223 10/11/2024     Chemistry      Component Value Date/Time   NA 137 10/11/2024 1110   K 4.2 10/11/2024 1110   CL 104 10/11/2024 1110   CO2 26 10/11/2024 1110   BUN 8 10/11/2024 1110   CREATININE 0.74 10/11/2024 1110      Component Value Date/Time   CALCIUM 9.7 10/11/2024 1110   ALKPHOS 45 10/11/2024 1110   AST 19 10/11/2024 1110   ALT 12 10/11/2024 1110   BILITOT 0.4 10/11/2024 1110       RADIOGRAPHIC STUDIES: I have personally reviewed the  radiological images as listed and agreed with the findings in the report. DG C-Arm 1-60 Min Result Date: 11/29/2024 CLINICAL DATA:  Insertion of tunneled central venous device with port. EXAM: DG C-ARM 1-60 MIN FLUOROSCOPY: Fluoroscopy Time:  14.5 seconds Radiation Exposure Index (if provided by the fluoroscopic device): 0.8 mGy Number of Acquired Spot Images: 2 COMPARISON:  None Available. FINDINGS: Two fluoroscopic spot views of the chest submitted from the operating room. Right chest port in place with tip overlying the SVC. IMPRESSION: Procedural fluoroscopy. Electronically Signed   By: Andrea Gasman M.D.   On: 11/29/2024 17:40   DG C-Arm 1-60 Min-No Report Result Date: 11/23/2024 Fluoroscopy was utilized by the requesting physician.  No radiographic interpretation.    All questions were answered. The patient knows to call the clinic with any problems, questions or concerns. I spent 60 minutes in the care of this patient including H and P, review of records, counseling and coordination of care.     Ashiah Stalls, MD 12/12/2024 1:02 PM

## 2024-12-13 ENCOUNTER — Other Ambulatory Visit: Payer: Self-pay | Admitting: Pharmacist

## 2024-12-13 ENCOUNTER — Other Ambulatory Visit: Payer: Self-pay | Admitting: Hematology and Oncology

## 2024-12-13 ENCOUNTER — Encounter: Payer: Self-pay | Admitting: *Deleted

## 2024-12-13 ENCOUNTER — Telehealth: Payer: Self-pay | Admitting: *Deleted

## 2024-12-13 ENCOUNTER — Encounter: Payer: Self-pay | Admitting: Hematology and Oncology

## 2024-12-13 DIAGNOSIS — C50412 Malignant neoplasm of upper-outer quadrant of left female breast: Secondary | ICD-10-CM

## 2024-12-13 NOTE — Telephone Encounter (Signed)
 Per Dr. Walter request, referral faxed to Dr. Isidor, cardiology at [facility] Fax confirmation received

## 2024-12-13 NOTE — Telephone Encounter (Signed)
 Spoke with patient in regards to fertility preservation and she informed me that her and her husband have decided not to pursue this.  She is scheduled with cardiology on 2/9 and scheduled to start chemo 2/10.

## 2024-12-13 NOTE — Progress Notes (Signed)
 START OFF PATHWAY REGIMEN - Breast   [id number]:Carboplatin AUC=1.5 IV D1,8,15 + Paclitaxel 80 mg/m2 IV D1,8,15 + G-CSF q21 Days + Pembrolizumab 400 mg IV D1 q42 Days x 12 Weeks Followed by [facility] + G-CSF q21 Days + Pembrolizumab 400 mg IV D1 q42 Days x 12 Weeks:   Pembrolizumab cycles 1 through 4: A cycle is every 42 days:     Pembrolizumab    Chemotherapy cycles 1 through 4: A cycle is every 21 days:     Paclitaxel      Carboplatin      Filgrastim-xxxx    Chemotherapy cycles 5 through 8: A cycle is every 21 days:     Doxorubicin      Cyclophosphamide      Pegfilgrastim-xxxx   **Always confirm dose/schedule in your pharmacy ordering system**  Clinician Citation:   Patient Characteristics: Postoperative without Neoadjuvant Therapy, M0 (Pathologic Staging), Invasive Disease, Adjuvant Therapy, HER2 Negative, ER Negative, Node Positive Therapeutic Status: Postoperative without Neoadjuvant Therapy, M0 (Pathologic Staging) AJCC T Category: pTX AJCC N Category: pNX AJCC M Category: cM0 AJCC Grade: GX ER Status: Negative (-) PR Status: Negative (-) HER2 Status: Negative (-) Oncotype Dx Recurrence Score: Not Appropriate AJCC 8 Stage Grouping: IIIC Intent of Therapy: Curative Intent, Discussed with Patient

## 2024-12-14 ENCOUNTER — Inpatient Hospital Stay: Payer: Self-pay

## 2024-12-14 ENCOUNTER — Inpatient Hospital Stay: Payer: Self-pay | Admitting: Hematology and Oncology

## 2024-12-14 ENCOUNTER — Inpatient Hospital Stay: Payer: Self-pay | Admitting: Pharmacist

## 2024-12-14 ENCOUNTER — Ambulatory Visit (HOSPITAL_COMMUNITY)
Admission: RE | Admit: 2024-12-14 | Discharge: 2024-12-14 | Disposition: A | Discharge status: Home / Self Care | Source: Ambulatory Visit | Attending: Hematology and Oncology | Admitting: Hematology and Oncology

## 2024-12-14 ENCOUNTER — Other Ambulatory Visit: Payer: Self-pay

## 2024-12-14 DIAGNOSIS — Z171 Estrogen receptor negative status [ER-]: Secondary | ICD-10-CM | POA: Diagnosis not present

## 2024-12-14 DIAGNOSIS — I059 Rheumatic mitral valve disease, unspecified: Secondary | ICD-10-CM | POA: Insufficient documentation

## 2024-12-14 DIAGNOSIS — Z9882 Breast implant status: Secondary | ICD-10-CM | POA: Insufficient documentation

## 2024-12-14 DIAGNOSIS — Z9010 Acquired absence of unspecified breast and nipple: Secondary | ICD-10-CM | POA: Insufficient documentation

## 2024-12-14 DIAGNOSIS — C50412 Malignant neoplasm of upper-outer quadrant of left female breast: Secondary | ICD-10-CM

## 2024-12-14 DIAGNOSIS — C50919 Malignant neoplasm of unspecified site of unspecified female breast: Secondary | ICD-10-CM | POA: Insufficient documentation

## 2024-12-14 DIAGNOSIS — Z0189 Encounter for other specified special examinations: Secondary | ICD-10-CM

## 2024-12-14 LAB — ECHOCARDIOGRAM COMPLETE
Area-P 1/2: 3.95 cm2
Calc EF: 63.6 %
Single Plane A2C EF: 62.6 %
Single Plane A4C EF: 60.2 %

## 2024-12-14 MED ORDER — LIDOCAINE-PRILOCAINE 2.5-2.5 % EX CREA: TOPICAL_CREAM | CUTANEOUS | 3 refills | Status: AC

## 2024-12-14 MED ORDER — ONDANSETRON HCL 8 MG PO TABS: 8.0000 mg | ORAL_TABLET | Freq: Three times a day (TID) | ORAL | 1 refills | Status: AC | PRN

## 2024-12-14 MED ORDER — PROCHLORPERAZINE MALEATE 10 MG PO TABS: 10.0000 mg | ORAL_TABLET | Freq: Four times a day (QID) | ORAL | 1 refills | Status: AC | PRN

## 2024-12-14 MED ORDER — DEXAMETHASONE 4 MG PO TABS: ORAL_TABLET | ORAL | 1 refills | Status: AC

## 2024-12-14 NOTE — Progress Notes (Signed)
" °  Echocardiogram 2D Echocardiogram has been performed.  Sandra Burton 12/14/2024, 4:00 PM "

## 2024-12-14 NOTE — Progress Notes (Signed)
 " [facility]        Telephone: [phone]?Fax: [phone]   Oncology Clinical Pharmacist Practitioner Initial Assessment   Sandra Burton is a 34 y.o. female with a diagnosis of breast cancer. They were contacted today via in-person visit. She is accompanied by her husband.  Indication/Regimen Pembrolizumab (Keytruda), paclitaxel (Taxol), carboplatin (Paraplatin ) followed by pembrolizumab Sigmund), doxorubicin (Adriamycin), cyclophosphamide (Cytoxan) is being used appropriately for treatment of breast cancer by Dr. Amarachi Stalls.      Wt Readings from Last 1 Encounters:  12/12/24 136 lb 14.4 oz (62.1 kg)    Estimated body surface area is 1.71 meters squared as calculated from the following:   Height as of 12/12/24: 5' 7 (1.702 m).   Weight as of 12/12/24: 136 lb 14.4 oz (62.1 kg).  The dosing regimen cycle is every 21 days x 4 cycles  Pembrolizumab (200 mg) on Day 1 Paclitaxel (80 mg/m2) on Days 1, 8, 15 Carboplatin (AUC 1.5) on Day 1, 8, 15 Filgrastim (300 mcg or 480 mcg based on weight)  on Days 16, 17, 18  Followed by the below regimen cycle every 21 days x 4 cycles  Pembrolizumab (200 mg) Day 1 Doxorubicin (60 mg/m2) Day 1 Cyclophosphamide (600 mg/m2) Day 1 Pegfilgrastim (6 mg) on Day 3  It is planned to continue until treatment plan completion or unacceptable toxicity. The tentative start date is: 12/26/24  Dose Modifications None   Allergies Allergies[1]  Vitals    12/12/2024   12:35 PM 11/24/2024    7:00 AM 11/24/2024   12:00 AM  Oncology Vitals  Height 170 cm    Weight 62.097 kg    Weight (lbs) 136 lbs 14 oz    BMI 21.44 kg/m2    Temp 98.2 F (36.8 C) 98.8 F (37.1 C) 98.5 F (36.9 C)  Pulse Rate 120 76 83  BP 102/74 87/50 84/46   Resp 17 16 16   SpO2 96 % 99 % 98 %  BSA (m2) 1.71 m2       Laboratory Data    Latest Ref Rng & Units 10/11/2024   11:10 AM 06/17/2021    5:36 AM 06/16/2021    6:30 AM  CBC EXTENDED  WBC 4.0 -  10.5 K/uL 7.0  13.2  12.4   RBC 3.87 - 5.11 MIL/uL 4.10  3.12  3.39   Hemoglobin 12.0 - 15.0 g/dL 87.1  89.9  89.0   HCT 36.0 - 46.0 % 38.5  30.6  33.2   Platelets 150 - 400 K/uL 223  168  174   NEUT# 1.7 - 7.7 K/uL 4.8     Lymph# 0.7 - 4.0 K/uL 1.6          Latest Ref Rng & Units 10/11/2024   11:10 AM  CMP  Glucose 70 - 99 mg/dL 94   BUN 6 - 20 mg/dL 8   Creatinine 9.55 - 8.99 mg/dL 9.25   Sodium 864 - 854 mmol/L 137   Potassium 3.5 - 5.1 mmol/L 4.2   Chloride 98 - 111 mmol/L 104   CO2 22 - 32 mmol/L 26   Calcium 8.9 - 10.3 mg/dL 9.7   Total Protein 6.5 - 8.1 g/dL 7.3   Total Bilirubin 0.0 - 1.2 mg/dL 0.4   Alkaline Phos 38 - 126 U/L 45   AST 15 - 41 U/L 19   ALT 0 - 44 U/L 12     Contraindications Contraindications were reviewed? Yes Contraindications to therapy were  identified? No   Safety Precautions The following safety precautions were reviewed:  Fever: reviewed the importance of having a thermometer and the Centers for Disease Control and Prevention (CDC) definition of fever which is 100.55F (38C) or higher. Patient should call 24/7 triage at [phone] if experiencing a fever or any other symptoms Decreased white blood cells (WBCs) and increased risk for infection Decreased platelet count and increased risk of bleeding Decreased hemoglobin, part of the red blood cells that carry iron and oxygen Nausea or vomiting Diarrhea or constipation Hair Loss Fatigue Changes in liver function Rash Peripheral Neuropathy Hypersensitivity reactions Pembrolizumab toxicities (skin, lung, liver, thyroid, kidneys, vision) Changes in color of urine Mouth sores MDS/AML Grapefruit products may interact with paclitaxel. Avoid use Handling body fluids and waste Intimacy, sexual activity, contraception, fertility  Medication Reconciliation Current Outpatient Medications  Medication Sig Dispense Refill   dexamethasone  (DECADRON ) 4 MG tablet Take 2 tablets daily for 2 days,  start the day after chemotherapy. Take with food. 30 tablet 1   lidocaine -prilocaine  (EMLA ) cream Apply to affected area once 30 g 3   ondansetron  (ZOFRAN ) 8 MG tablet Take 1 tablet (8 mg total) by mouth every 8 (eight) hours as needed for nausea or vomiting. Start on the third day after chemotherapy. 30 tablet 1   prochlorperazine  (COMPAZINE ) 10 MG tablet Take 1 tablet (10 mg total) by mouth every 6 (six) hours as needed for nausea or vomiting. 30 tablet 1   No current facility-administered medications for this visit.    Medication reconciliation is based on the patient's most recent medication list in the electronic medical record (EMR) including herbal products and OTC medications.   The patient's medication list was reviewed today with the patient? Yes   Drug-drug interactions (DDIs) DDIs were evaluated? Yes Significant DDIs identified? No   Drug-Food Interactions Drug-food interactions were evaluated? Yes Drug-food interactions identified? Grapefruit products  Follow-up Plan  Treatment start date: 12/26/24 Port placement date: 11/23/24 ECHO date: 12/14/24. She will also see cardiology on 12/25/24 We reviewed the prescriptions, premedications, and treatment regimen with the patient. Possible side effects of the treatment regimen were reviewed and management strategies were discussed.  Can use over-the-counter (OTC) options of loperamide (Imodium) as needed for diarrhea, loratadine (Claritin) as needed for G-CSF bone pain, and docusate + senna (Senna-S) as needed for constipation.  Sandra Burton is not interested in fertility preservation.  Sandra Burton is not interested in [employer] Confirmed with [facility] that total doxorubicin exposure 141.81 mg/m2 given from 02/07/08 to 05/14/08 while being treated for Hodgkin's Lymphoma. Medical oncologist was Lela Bold, MD. Dr. Loretha is aware and information added into Lifetime Dose Tracking within treatment plan. Confirmed  with [facility] that Sandra Burton received radiation to her left neck and mediastinum at 2100 cGy in 14 fractions (6 and 10 MV photons) from 05/31/2008-06/19/2008.  Clinical pharmacy will assist Dr. Tanyika Loretha and Jeoffrey Claudene on an as needed basis going forward  Sandra Burton participated in the discussion, expressed understanding, and voiced agreement with the above plan. All questions were answered to their satisfaction. The patient was advised to contact the clinic at [phone] with any questions or concerns prior to their return visit.   I spent 60 minutes assessing the patient.  Cammeron Greis A. Lucila, PharmD, BCOP, CPP  Norleen DELENA Lucila, RPH-CPP, 12/14/2024 11:53 AM  **Disclaimer: This note was dictated with voice recognition software. Similar sounding words can inadvertently be transcribed and  this note may contain transcription errors which may not have been corrected upon publication of note.**     [1]  Allergies Allergen Reactions   Sulfamethoxazole     History of sulfa allergy as a child   "

## 2024-12-18 ENCOUNTER — Encounter: Payer: Self-pay | Admitting: Hematology and Oncology

## 2024-12-19 ENCOUNTER — Encounter (HOSPITAL_COMMUNITY): Admission: RE | Admit: 2024-12-19 | Source: Ambulatory Visit

## 2024-12-19 ENCOUNTER — Ambulatory Visit (HOSPITAL_COMMUNITY)
Admission: RE | Admit: 2024-12-19 | Discharge: 2024-12-19 | Disposition: A | Source: Ambulatory Visit | Attending: Hematology and Oncology

## 2024-12-19 DIAGNOSIS — Z171 Estrogen receptor negative status [ER-]: Secondary | ICD-10-CM

## 2024-12-19 MED ORDER — IOHEXOL 300 MG/ML  SOLN
100.0000 mL | Freq: Once | INTRAMUSCULAR | Status: AC | PRN
  Administered 2024-12-19: 100 mL via INTRAVENOUS

## 2024-12-19 MED ORDER — TECHNETIUM TC 99M MEDRONATE IV KIT
22.0000 | PACK | Freq: Once | INTRAVENOUS | Status: AC
  Administered 2024-12-19: 22 via INTRAVENOUS

## 2024-12-20 ENCOUNTER — Other Ambulatory Visit: Payer: Self-pay | Admitting: Hematology and Oncology

## 2024-12-21 ENCOUNTER — Encounter: Payer: Self-pay | Admitting: *Deleted

## 2024-12-21 ENCOUNTER — Ambulatory Visit: Payer: Self-pay | Admitting: Hematology and Oncology

## 2024-12-22 ENCOUNTER — Encounter: Payer: Self-pay | Admitting: Hematology and Oncology

## 2024-12-22 NOTE — Progress Notes (Signed)
 Per MD RN faxed prescription for wig to [facility]. Pt notified via mychart.

## 2024-12-26 ENCOUNTER — Inpatient Hospital Stay

## 2024-12-26 ENCOUNTER — Inpatient Hospital Stay: Attending: Hematology and Oncology

## 2024-12-26 ENCOUNTER — Inpatient Hospital Stay: Admitting: Hematology and Oncology

## 2025-01-03 ENCOUNTER — Ambulatory Visit: Payer: Self-pay | Attending: Surgery | Admitting: Physical Therapy

## 2025-01-04 ENCOUNTER — Inpatient Hospital Stay

## 2025-01-04 ENCOUNTER — Inpatient Hospital Stay: Admitting: Adult Health

## 2025-01-08 ENCOUNTER — Inpatient Hospital Stay: Admitting: Adult Health

## 2025-01-08 ENCOUNTER — Inpatient Hospital Stay

## 2025-01-09 ENCOUNTER — Inpatient Hospital Stay

## 2025-01-10 ENCOUNTER — Inpatient Hospital Stay

## 2025-01-11 ENCOUNTER — Inpatient Hospital Stay

## 2025-01-12 ENCOUNTER — Inpatient Hospital Stay
